# Patient Record
Sex: Male | Born: 1945 | Race: White | Hispanic: No | Marital: Married | Smoking: Former smoker
Health system: Southern US, Community
[De-identification: ages and names within clinical notes are randomized; demographics above are authoritative.]

## PROBLEM LIST (undated history)

## (undated) DIAGNOSIS — E785 Hyperlipidemia, unspecified: Secondary | ICD-10-CM

## (undated) DIAGNOSIS — E079 Disorder of thyroid, unspecified: Secondary | ICD-10-CM

## (undated) DIAGNOSIS — I2510 Atherosclerotic heart disease of native coronary artery without angina pectoris: Secondary | ICD-10-CM

## (undated) DIAGNOSIS — I10 Essential (primary) hypertension: Secondary | ICD-10-CM

## (undated) DIAGNOSIS — I739 Peripheral vascular disease, unspecified: Secondary | ICD-10-CM

## (undated) DIAGNOSIS — E119 Type 2 diabetes mellitus without complications: Secondary | ICD-10-CM

## (undated) DIAGNOSIS — K219 Gastro-esophageal reflux disease without esophagitis: Secondary | ICD-10-CM

## (undated) DIAGNOSIS — I4819 Other persistent atrial fibrillation: Secondary | ICD-10-CM

## (undated) DIAGNOSIS — D649 Anemia, unspecified: Secondary | ICD-10-CM

## (undated) HISTORY — DX: Hyperlipidemia, unspecified: E78.5

## (undated) HISTORY — PX: CHOLECYSTECTOMY: SHX55

## (undated) HISTORY — DX: Type 2 diabetes mellitus without complications: E11.9

## (undated) HISTORY — PX: OTHER SURGICAL HISTORY: SHX169

## (undated) HISTORY — PX: KNEE ARTHROSCOPY W/ PARTIAL MEDIAL MENISCECTOMY: SHX1882

## (undated) HISTORY — PX: ROTATOR CUFF REPAIR: SHX139

## (undated) HISTORY — DX: Essential (primary) hypertension: I10

## (undated) HISTORY — DX: Peripheral vascular disease, unspecified: I73.9

## (undated) HISTORY — DX: Other persistent atrial fibrillation: I48.19

## (undated) HISTORY — DX: Atherosclerotic heart disease of native coronary artery without angina pectoris: I25.10

## (undated) HISTORY — DX: Disorder of thyroid, unspecified: E07.9

---

## 2020-04-16 ENCOUNTER — Encounter: Payer: Self-pay | Admitting: *Deleted

## 2020-04-23 ENCOUNTER — Encounter: Payer: Self-pay | Admitting: Internal Medicine

## 2020-04-23 ENCOUNTER — Encounter: Payer: Self-pay | Admitting: *Deleted

## 2020-04-23 ENCOUNTER — Other Ambulatory Visit: Payer: Self-pay

## 2020-04-23 ENCOUNTER — Ambulatory Visit (INDEPENDENT_AMBULATORY_CARE_PROVIDER_SITE_OTHER): Payer: Medicare Other | Admitting: Internal Medicine

## 2020-04-23 VITALS — BP 114/66 | HR 94 | Ht 68.0 in | Wt 158.6 lb

## 2020-04-23 DIAGNOSIS — R002 Palpitations: Secondary | ICD-10-CM

## 2020-04-23 DIAGNOSIS — I25810 Atherosclerosis of coronary artery bypass graft(s) without angina pectoris: Secondary | ICD-10-CM | POA: Diagnosis not present

## 2020-04-23 DIAGNOSIS — I4819 Other persistent atrial fibrillation: Secondary | ICD-10-CM

## 2020-04-23 NOTE — H&P (View-Only) (Signed)
Electrophysiology Office Note   Date:  04/23/2020   ID:  Thomas Simon, DOB 09/14/1945, MRN [id number]  PCP:  Glori Bickers, MD  Cardiologist:  Clent Ridges) Primary Electrophysiologist: Hillis Range, MD    AF   History of Present Illness: Thomas Simon is a 75 y.o. male who presents today for electrophysiology evaluation.   He has a past medical history significant for CAD s/p CABG, diabetes, HTN, hypothyroidism. He had his COVID booster at the end of November and then early December developed fatigue and shortness of breath with exertion. He was found to have newly identified atrial fibrillation. He was started on Eliquis and reports compliance with no missed doses. Recommendation was for consideration of amiodarone and DCCV but the patient was concerned about amiodarone side effects and asked to be seen today.    He does not smoke, drink, or use recreational drugs. 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 They have 3 sons and 5 grandchildren.    Today, he denies symptoms of palpitations, chest pain, orthopnea, PND, claudication, dizziness, presyncope, syncope, bleeding, or neurologic sequela. The patient is tolerating medications without difficulties and is otherwise without complaint today.    Past Medical History:  Diagnosis Date  . Coronary arteriosclerosis   . Diabetes mellitus without complication (HCC)   . Hyperlipidemia   . Hypertension   . PVD (peripheral vascular disease) (HCC)   . Thyroid disease    Past Surgical History:  Procedure Laterality Date  . CHOLECYSTECTOMY    . KNEE ARTHROSCOPY W/ PARTIAL MEDIAL MENISCECTOMY    . open heart surgery    . ROTATOR CUFF REPAIR       Current Outpatient Medications  Medication Sig Dispense Refill  . apixaban (ELIQUIS) 5 MG TABS tablet Take 5 mg by mouth 2 (two) times daily.    Marland Kitchen aspirin EC 81 MG tablet Take 81 mg by mouth daily. Swallow whole.    Marland Kitchen doxazosin (CARDURA) 4 MG tablet Take 4 mg by mouth daily.     [employer] Lanius Omega-3 500 MG CAPS Take by mouth.    . levothyroxine (SYNTHROID) 25 MCG tablet Take by mouth daily before breakfast.    . metFORMIN (GLUCOPHAGE) 500 MG tablet Take 500 mg by mouth 2 (two) times daily with a meal.    . metoprolol succinate (TOPROL-XL) 50 MG 24 hr tablet Take 50 mg by mouth daily. Take with or immediately following a meal.    . pantoprazole (PROTONIX) 40 MG tablet Take 40 mg by mouth daily.    . simvastatin (ZOCOR) 20 MG tablet Take 20 mg by mouth daily.    . vitamin B-12 (CYANOCOBALAMIN) 1000 MCG tablet Take 1,000 mcg by mouth daily.     No current facility-administered medications for this visit.    Allergies:   Celecoxib, Fentanyl, Hydrocodone, and Tramadol   Social History:  The patient  reports that he quit smoking about 16 years ago. His smokeless tobacco use includes chew. He reports that he does not drink alcohol and does not use drugs.   Family History:  The patient's family history includes CAD and DM in mother and stroke in father.    ROS:  Please see the history of present illness.   All other systems are personally reviewed and negative.    PHYSICAL EXAM: VS:  BP 114/66   Pulse 94   Ht 5\' 8"  (1.727 m)   Wt 158 lb 9.6 oz (71.9 kg)   SpO2 98%  BMI 24.12 kg/m  , BMI Body mass index is 24.12 kg/m. GEN: Well nourished, well developed, in no acute distress HEENT: normal Neck: no JVD, carotid bruits, or masses Cardiac: iRRR; no murmurs, rubs, or gallops,no edema  Respiratory:  clear to auscultation bilaterally, normal work of breathing GI: soft, nontender, nondistended, + BS MS: no deformity or atrophy Skin: warm and dry  Neuro:  Strength and sensation are intact Psych: euthymic mood, full affect   Recent Labs: No results found for requested labs within last 8760 hours.  personally reviewed   Lipid Panel  No results found for: CHOL, TRIG, HDL, CHOLHDL, VLDL, LDLCALC, LDLDIRECT personally reviewed   Wt Readings from Last 3  Encounters:  04/23/20 158 lb 9.6 oz (71.9 kg)      Other studies personally reviewed: Additional studies/ records that were reviewed today include: outside records   ASSESSMENT AND PLAN:  1.  Persistent atrial fibrillation The patient has persistent symptomatic atrial fibrillation On appropriate dose of Eliquis for CHADS2VASC of 5 Treatment options reviewed today. At this time, he would like to proceed with cardioversion in the absence of AAD therapy. Risks, benefits reviewed with patient who wishes to proceed. Will schedule at next available time. If he has further afib, we could consider multaq, tikosyn, amiodarone or ablation.  2.  HTN Stable No change required today  3.  CAD s/p CABG No recent ischemic symptoms Stop ASA   Risks, benefits and potential toxicities for medications prescribed and/or refilled reviewed with patient today.    Follow-up:  AF clinic 1 week after cardioversion I will see again in 6-8 weeks  Current medicines are reviewed at length with the patient today.   The patient does not have concerns regarding his medicines.  The following changes were made today:  none  Labs/ tests ordered today include:  Orders Placed This Encounter  Procedures  . EKG 12-Lead     Signed, Hillis Range, MD  04/23/2020 10:44 AM     [facility] [address] [place] [address] [phone] (office) [phone] (fax)

## 2020-04-23 NOTE — Patient Instructions (Addendum)
Medication Instructions:  Stop aspirin *If you need a refill on your cardiac medications before your next appointment, please call your pharmacy*   Lab Work: none If you have labs (blood work) drawn today and your tests are completely normal, you will receive your results only by: Marland Kitchen MyChart Message (if you have MyChart) OR . A paper copy in the mail If you have any lab test that is abnormal or we need to change your treatment, we will call you to review the results.   Testing/Procedures: Your physician has recommended that you have a Cardioversion (DCCV). Electrical Cardioversion uses a jolt of electricity to your heart either through paddles or wired patches attached to your chest. This is a controlled, usually prescheduled, procedure. Defibrillation is done under light anesthesia in the hospital, and you usually go home the day of the procedure. This is done to get your heart back into a normal rhythm. You are not awake for the procedure. Please see the instruction sheet given to you today.   Follow-Up: At [facility], you and your health needs are our priority.  As part of our continuing mission to provide you with exceptional heart care, we have created designated Provider Care Teams.  These Care Teams include your primary Cardiologist (physician) and Advanced Practice Providers (APPs -  Physician Assistants and Nurse Practitioners) who all work together to provide you with the care you need, when you need it.  We recommend signing up for the patient portal called "MyChart".  Sign up information is provided on this After Visit Summary.  MyChart is used to connect with patients for Virtual Visits (Telemedicine).  Patients are able to view lab/test results, encounter notes, upcoming appointments, etc.  Non-urgent messages can be sent to your provider as well.   To learn more about what you can do with MyChart, go to [email or web address].    06/07/2020 at 11:30 with Dr. Johney Frame  Other  Instructions        COVID TEST--Jan 26 at 1 pm-- You will go to [address] [place], [place] [address]  for your Covid testing.   This is a drive thru test site.   Be sure to share with the first checkpoint that you are there for pre-procedure/surgery testing. Stay in your car and the nurse team will come to your car to test you.  After you are tested please go home and self-quarantine until the day of your procedure.     You are scheduled for a Cardioversion on Apr 30, 2020 with Dr. Duke Salvia.  Please arrive at the [facility] (Main Entrance A) at [facility]: [address] [place], [place] [address] at 8 am. DIET: Nothing to eat or drink after midnight except a sip of water with medications (see medication instructions below)  Medication Instructions: Hold metformin  Continue your anticoagulant: Eliquis You will need to continue your anticoagulant after your procedure until you  are told by your  Provider that it is safe to stop   You must have a responsible person to drive you home and stay in the waiting area during your procedure. Failure to do so could result in cancellation.  Bring your insurance cards.  *Special Note: Every effort is made to have your procedure done on time. Occasionally there are emergencies that occur at the hospital that may cause delays. Please be patient if a delay does occur.

## 2020-04-23 NOTE — Progress Notes (Signed)
 Electrophysiology Office Note   Date:  04/23/2020   ID:  Thomas Simon, DOB 06/04/1945, MRN [id number]  PCP:  Trivedi, Rajendra, MD  Cardiologist:  Zachary ([place]) Primary Electrophysiologist: Oslo Huntsman, MD    AF   History of Present Illness: Thomas Simon is a 75 y.o. male who presents today for electrophysiology evaluation.   He has a past medical history significant for CAD s/p CABG, diabetes, HTN, hypothyroidism. He had his COVID booster at the end of November and then early December developed fatigue and shortness of breath with exertion. He was found to have newly identified atrial fibrillation. He was started on Eliquis and reports compliance with no missed doses. Recommendation was for consideration of amiodarone and DCCV but the patient was concerned about amiodarone side effects and asked to be seen today.    He does not smoke, drink, or use recreational drugs. 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 They have 3 sons and 5 grandchildren.    Today, he denies symptoms of palpitations, chest pain, orthopnea, PND, claudication, dizziness, presyncope, syncope, bleeding, or neurologic sequela. The patient is tolerating medications without difficulties and is otherwise without complaint today.    Past Medical History:  Diagnosis Date  . Coronary arteriosclerosis   . Diabetes mellitus without complication (HCC)   . Hyperlipidemia   . Hypertension   . PVD (peripheral vascular disease) (HCC)   . Thyroid disease    Past Surgical History:  Procedure Laterality Date  . CHOLECYSTECTOMY    . KNEE ARTHROSCOPY W/ PARTIAL MEDIAL MENISCECTOMY    . open heart surgery    . ROTATOR CUFF REPAIR       Current Outpatient Medications  Medication Sig Dispense Refill  . apixaban (ELIQUIS) 5 MG TABS tablet Take 5 mg by mouth 2 (two) times daily.    . aspirin EC 81 MG tablet Take 81 mg by mouth daily. Swallow whole.    . doxazosin (CARDURA) 4 MG tablet Take 4 mg by mouth daily.     . Krill Oil Omega-3 500 MG CAPS Take by mouth.    . levothyroxine (SYNTHROID) 25 MCG tablet Take by mouth daily before breakfast.    . metFORMIN (GLUCOPHAGE) 500 MG tablet Take 500 mg by mouth 2 (two) times daily with a meal.    . metoprolol succinate (TOPROL-XL) 50 MG 24 hr tablet Take 50 mg by mouth daily. Take with or immediately following a meal.    . pantoprazole (PROTONIX) 40 MG tablet Take 40 mg by mouth daily.    . simvastatin (ZOCOR) 20 MG tablet Take 20 mg by mouth daily.    . vitamin B-12 (CYANOCOBALAMIN) 1000 MCG tablet Take 1,000 mcg by mouth daily.     No current facility-administered medications for this visit.    Allergies:   Celecoxib, Fentanyl, Hydrocodone, and Tramadol   Social History:  The patient  reports that he quit smoking about 16 years ago. His smokeless tobacco use includes chew. He reports that he does not drink alcohol and does not use drugs.   Family History:  The patient's family history includes CAD and DM in mother and stroke in father.    ROS:  Please see the history of present illness.   All other systems are personally reviewed and negative.    PHYSICAL EXAM: VS:  BP 114/66   Pulse 94   Ht 5' 8" (1.727 m)   Wt 158 lb 9.6 oz (71.9 kg)   SpO2 98%     BMI 24.12 kg/m  , BMI Body mass index is 24.12 kg/m. GEN: Well nourished, well developed, in no acute distress HEENT: normal Neck: no JVD, carotid bruits, or masses Cardiac: iRRR; no murmurs, rubs, or gallops,no edema  Respiratory:  clear to auscultation bilaterally, normal work of breathing GI: soft, nontender, nondistended, + BS MS: no deformity or atrophy Skin: warm and dry  Neuro:  Strength and sensation are intact Psych: euthymic mood, full affect   Recent Labs: No results found for requested labs within last 8760 hours.  personally reviewed   Lipid Panel  No results found for: CHOL, TRIG, HDL, CHOLHDL, VLDL, LDLCALC, LDLDIRECT personally reviewed   Wt Readings from Last 3  Encounters:  04/23/20 158 lb 9.6 oz (71.9 kg)      Other studies personally reviewed: Additional studies/ records that were reviewed today include: outside records   ASSESSMENT AND PLAN:  1.  Persistent atrial fibrillation The patient has persistent symptomatic atrial fibrillation On appropriate dose of Eliquis for CHADS2VASC of 5 Treatment options reviewed today. At this time, he would like to proceed with cardioversion in the absence of AAD therapy. Risks, benefits reviewed with patient who wishes to proceed. Will schedule at next available time. If he has further afib, we could consider multaq, tikosyn, amiodarone or ablation.  2.  HTN Stable No change required today  3.  CAD s/p CABG No recent ischemic symptoms Stop ASA   Risks, benefits and potential toxicities for medications prescribed and/or refilled reviewed with patient today.    Follow-up:  AF clinic 1 week after cardioversion I will see again in 6-8 weeks  Current medicines are reviewed at length with the patient today.   The patient does not have concerns regarding his medicines.  The following changes were made today:  none  Labs/ tests ordered today include:  Orders Placed This Encounter  Procedures  . EKG 12-Lead     Signed, Hillis Range, MD  04/23/2020 10:44 AM     [facility] [address] [place] [address] [phone] (office) [phone] (fax)

## 2020-04-28 ENCOUNTER — Other Ambulatory Visit (HOSPITAL_COMMUNITY)
Admission: RE | Admit: 2020-04-28 | Discharge: 2020-04-28 | Disposition: A | Discharge status: Home / Self Care | Payer: Medicare Other | Source: Ambulatory Visit | Attending: Cardiovascular Disease | Admitting: Cardiovascular Disease

## 2020-04-28 DIAGNOSIS — Z01812 Encounter for preprocedural laboratory examination: Secondary | ICD-10-CM | POA: Insufficient documentation

## 2020-04-28 DIAGNOSIS — Z20822 Contact with and (suspected) exposure to covid-19: Secondary | ICD-10-CM | POA: Insufficient documentation

## 2020-04-28 LAB — SARS CORONAVIRUS 2 (TAT 6-24 HRS): SARS Coronavirus 2: NEGATIVE

## 2020-04-30 ENCOUNTER — Other Ambulatory Visit: Payer: Self-pay

## 2020-04-30 ENCOUNTER — Ambulatory Visit (HOSPITAL_COMMUNITY): Payer: Medicare Other | Admitting: Anesthesiology

## 2020-04-30 ENCOUNTER — Encounter (HOSPITAL_COMMUNITY): Payer: Self-pay | Admitting: Cardiovascular Disease

## 2020-04-30 ENCOUNTER — Ambulatory Visit (HOSPITAL_COMMUNITY)
Admission: RE | Admit: 2020-04-30 | Discharge: 2020-04-30 | Disposition: A | Discharge status: Home / Self Care | Payer: Medicare Other | Attending: Cardiovascular Disease | Admitting: Cardiovascular Disease

## 2020-04-30 ENCOUNTER — Encounter (HOSPITAL_COMMUNITY)
Admission: RE | Disposition: A | Discharge status: Home / Self Care | Payer: Self-pay | Source: Home / Self Care | Attending: Cardiovascular Disease

## 2020-04-30 DIAGNOSIS — I2510 Atherosclerotic heart disease of native coronary artery without angina pectoris: Secondary | ICD-10-CM | POA: Diagnosis not present

## 2020-04-30 DIAGNOSIS — Z79899 Other long term (current) drug therapy: Secondary | ICD-10-CM | POA: Insufficient documentation

## 2020-04-30 DIAGNOSIS — I4819 Other persistent atrial fibrillation: Secondary | ICD-10-CM | POA: Insufficient documentation

## 2020-04-30 DIAGNOSIS — Z951 Presence of aortocoronary bypass graft: Secondary | ICD-10-CM | POA: Diagnosis not present

## 2020-04-30 DIAGNOSIS — Z885 Allergy status to narcotic agent status: Secondary | ICD-10-CM | POA: Insufficient documentation

## 2020-04-30 DIAGNOSIS — Z79890 Hormone replacement therapy (postmenopausal): Secondary | ICD-10-CM | POA: Insufficient documentation

## 2020-04-30 DIAGNOSIS — Z888 Allergy status to other drugs, medicaments and biological substances status: Secondary | ICD-10-CM | POA: Insufficient documentation

## 2020-04-30 DIAGNOSIS — Z7982 Long term (current) use of aspirin: Secondary | ICD-10-CM | POA: Insufficient documentation

## 2020-04-30 DIAGNOSIS — Z87891 Personal history of nicotine dependence: Secondary | ICD-10-CM | POA: Diagnosis not present

## 2020-04-30 DIAGNOSIS — Z7901 Long term (current) use of anticoagulants: Secondary | ICD-10-CM | POA: Diagnosis not present

## 2020-04-30 DIAGNOSIS — I10 Essential (primary) hypertension: Secondary | ICD-10-CM | POA: Insufficient documentation

## 2020-04-30 DIAGNOSIS — Z7984 Long term (current) use of oral hypoglycemic drugs: Secondary | ICD-10-CM | POA: Insufficient documentation

## 2020-04-30 HISTORY — PX: CARDIOVERSION: SHX1299

## 2020-04-30 LAB — POCT I-STAT, CHEM 8
BUN: 19 mg/dL (ref 8–23)
Calcium, Ion: 1.19 mmol/L (ref 1.15–1.40)
Chloride: 98 mmol/L (ref 98–111)
Creatinine, Ser: 0.9 mg/dL (ref 0.61–1.24)
Glucose, Bld: 155 mg/dL — ABNORMAL HIGH (ref 70–99)
HCT: 35 % — ABNORMAL LOW (ref 39.0–52.0)
Hemoglobin: 11.9 g/dL — ABNORMAL LOW (ref 13.0–17.0)
Potassium: 4.5 mmol/L (ref 3.5–5.1)
Sodium: 138 mmol/L (ref 135–145)
TCO2: 25 mmol/L (ref 22–32)

## 2020-04-30 SURGERY — CARDIOVERSION
Anesthesia: General

## 2020-04-30 MED ORDER — PROPOFOL 10 MG/ML IV BOLUS
INTRAVENOUS | Status: DC | PRN
  Administered 2020-04-30: 50 mg via INTRAVENOUS

## 2020-04-30 MED ORDER — LIDOCAINE 2% (20 MG/ML) 5 ML SYRINGE
INTRAMUSCULAR | Status: DC | PRN
  Administered 2020-04-30: 60 mg via INTRAVENOUS

## 2020-04-30 MED ORDER — SODIUM CHLORIDE 0.9 % IV SOLN
INTRAVENOUS | Status: AC | PRN
  Administered 2020-04-30: 500 mL via INTRAVENOUS

## 2020-04-30 NOTE — Transfer of Care (Signed)
Immediate Anesthesia Transfer of Care Note  Patient: Thomas Simon  Procedure(s) Performed: CARDIOVERSION (N/A )  Patient Location: PACU and Endoscopy Unit  Anesthesia Type:General  Level of Consciousness: drowsy and patient cooperative  Airway & Oxygen Therapy: Patient Spontanous Breathing and Patient connected to nasal cannula oxygen  Post-op Assessment: Report given to RN and Post -op Vital signs reviewed and stable  Post vital signs: Reviewed and stable  Last Vitals:  Vitals Value Taken Time  BP    Temp    Pulse    Resp    SpO2      Last Pain:  Vitals:   04/30/20 0846  TempSrc: Oral  PainSc: 0-No pain         Complications: No complications documented.

## 2020-04-30 NOTE — CV Procedure (Signed)
Electrical Cardioversion Procedure Note Thomas Simon [id number] September 22, 1945  Procedure: Electrical Cardioversion Indications:  Atrial Fibrillation  Procedure Details Consent: Risks of procedure as well as the alternatives and risks of each were explained to the (patient/caregiver).  Consent for procedure obtained. Time Out: Verified patient identification, verified procedure, site/side was marked, verified correct patient position, special equipment/implants available, medications/allergies/relevent history reviewed, required imaging and test results available.  Performed  Patient placed on cardiac monitor, pulse oximetry, supplemental oxygen as necessary.  Sedation given: propofol Pacer pads placed anterior and posterior chest.  Cardioverted 1 time(s).  Cardioverted at 150J.  Evaluation Findings: Post procedure EKG shows: sinus rhythm with PACs Complications: None Patient did tolerate procedure well.   Chilton Si, MD 04/30/2020, 9:27 AM

## 2020-04-30 NOTE — Interval H&P Note (Signed)
History and Physical Interval Note:  04/30/2020 9:16 AM  Thomas Simon  has presented today for surgery, with the diagnosis of A-FIB.  The various methods of treatment have been discussed with the patient and family. After consideration of risks, benefits and other options for treatment, the patient has consented to  Procedure(s): CARDIOVERSION (N/A) as a surgical intervention.  The patient's history has been reviewed, patient examined, no change in status, stable for surgery.  I have reviewed the patient's chart and labs.  Questions were answered to the patient's satisfaction.     Chilton Si, MD

## 2020-04-30 NOTE — Discharge Instructions (Signed)

## 2020-04-30 NOTE — Anesthesia Preprocedure Evaluation (Addendum)
Anesthesia Evaluation  Patient identified by MRN, date of birth, ID band Patient awake    Reviewed: Allergy & Precautions, H&P , NPO status , Patient's Chart, lab work & pertinent test results, reviewed documented beta blocker date and time   Airway Mallampati: II  TM Distance: >3 FB Neck ROM: Full    Dental no notable dental hx. (+) Upper Dentures, Dental Advisory Given   Pulmonary neg pulmonary ROS, former smoker,    Pulmonary exam normal breath sounds clear to auscultation       Cardiovascular hypertension, Pt. on medications and Pt. on home beta blockers + Peripheral Vascular Disease  + dysrhythmias Atrial Fibrillation  Rhythm:Irregular Rate:Normal     Neuro/Psych negative neurological ROS  negative psych ROS   GI/Hepatic negative GI ROS, Neg liver ROS,   Endo/Other  diabetes, Type 2, Oral Hypoglycemic Agents  Renal/GU negative Renal ROS  negative genitourinary   Musculoskeletal   Abdominal   Peds  Hematology negative hematology ROS (+)   Anesthesia Other Findings   Reproductive/Obstetrics negative OB ROS                            Anesthesia Physical Anesthesia Plan  ASA: III  Anesthesia Plan: General   Post-op Pain Management:    Induction: Intravenous  PONV Risk Score and Plan: 2 and Propofol infusion and Treatment may vary due to age or medical condition  Airway Management Planned: Mask  Additional Equipment:   Intra-op Plan:   Post-operative Plan:   Informed Consent: I have reviewed the patients History and Physical, chart, labs and discussed the procedure including the risks, benefits and alternatives for the proposed anesthesia with the patient or authorized representative who has indicated his/her understanding and acceptance.     Dental advisory given  Plan Discussed with: CRNA  Anesthesia Plan Comments:         Anesthesia Quick Evaluation

## 2020-04-30 NOTE — Anesthesia Postprocedure Evaluation (Signed)
Anesthesia Post Note  Patient: Thomas Simon  Procedure(s) Performed: CARDIOVERSION (N/A )     Patient location during evaluation: Endoscopy Anesthesia Type: General Level of consciousness: awake and alert Pain management: pain level controlled Vital Signs Assessment: post-procedure vital signs reviewed and stable Respiratory status: spontaneous breathing, nonlabored ventilation and respiratory function stable Cardiovascular status: blood pressure returned to baseline and stable Postop Assessment: no apparent nausea or vomiting Anesthetic complications: no   No complications documented.  Last Vitals:  Vitals:   04/30/20 0950 04/30/20 1000  BP: 113/76 130/67  Pulse: 72 65  Resp: 15 16  Temp:    SpO2: 98% 97%    Last Pain:  Vitals:   04/30/20 1010  TempSrc:   PainSc: 0-No pain                 Bethan Adamek,W. EDMOND

## 2020-05-02 ENCOUNTER — Encounter (HOSPITAL_COMMUNITY): Payer: Self-pay | Admitting: Cardiovascular Disease

## 2020-05-06 ENCOUNTER — Ambulatory Visit (HOSPITAL_COMMUNITY): Payer: No Typology Code available for payment source | Admitting: Physician Assistant

## 2020-05-06 ENCOUNTER — Ambulatory Visit (HOSPITAL_COMMUNITY)
Admission: RE | Admit: 2020-05-06 | Discharge: 2020-05-06 | Disposition: A | Discharge status: Home / Self Care | Payer: Medicare Other | Source: Ambulatory Visit | Attending: Physician Assistant | Admitting: Physician Assistant

## 2020-05-06 ENCOUNTER — Encounter (HOSPITAL_COMMUNITY): Payer: Self-pay | Admitting: Physician Assistant

## 2020-05-06 ENCOUNTER — Other Ambulatory Visit: Payer: Self-pay

## 2020-05-06 VITALS — BP 102/68 | HR 75 | Ht 68.0 in | Wt 156.4 lb

## 2020-05-06 DIAGNOSIS — I2510 Atherosclerotic heart disease of native coronary artery without angina pectoris: Secondary | ICD-10-CM | POA: Diagnosis not present

## 2020-05-06 DIAGNOSIS — Z888 Allergy status to other drugs, medicaments and biological substances status: Secondary | ICD-10-CM | POA: Insufficient documentation

## 2020-05-06 DIAGNOSIS — I10 Essential (primary) hypertension: Secondary | ICD-10-CM | POA: Diagnosis not present

## 2020-05-06 DIAGNOSIS — Z951 Presence of aortocoronary bypass graft: Secondary | ICD-10-CM | POA: Insufficient documentation

## 2020-05-06 DIAGNOSIS — Z7901 Long term (current) use of anticoagulants: Secondary | ICD-10-CM | POA: Diagnosis not present

## 2020-05-06 DIAGNOSIS — E039 Hypothyroidism, unspecified: Secondary | ICD-10-CM | POA: Insufficient documentation

## 2020-05-06 DIAGNOSIS — Z885 Allergy status to narcotic agent status: Secondary | ICD-10-CM | POA: Insufficient documentation

## 2020-05-06 DIAGNOSIS — I4819 Other persistent atrial fibrillation: Secondary | ICD-10-CM | POA: Diagnosis present

## 2020-05-06 DIAGNOSIS — E119 Type 2 diabetes mellitus without complications: Secondary | ICD-10-CM | POA: Diagnosis not present

## 2020-05-06 DIAGNOSIS — D6869 Other thrombophilia: Secondary | ICD-10-CM | POA: Diagnosis not present

## 2020-05-06 DIAGNOSIS — Z7984 Long term (current) use of oral hypoglycemic drugs: Secondary | ICD-10-CM | POA: Diagnosis not present

## 2020-05-06 DIAGNOSIS — Z79890 Hormone replacement therapy (postmenopausal): Secondary | ICD-10-CM | POA: Insufficient documentation

## 2020-05-06 DIAGNOSIS — Z87891 Personal history of nicotine dependence: Secondary | ICD-10-CM | POA: Insufficient documentation

## 2020-05-06 DIAGNOSIS — Z886 Allergy status to analgesic agent status: Secondary | ICD-10-CM | POA: Diagnosis not present

## 2020-05-06 DIAGNOSIS — Z79899 Other long term (current) drug therapy: Secondary | ICD-10-CM | POA: Insufficient documentation

## 2020-05-06 NOTE — Progress Notes (Signed)
Primary Care Physician: Glori Bickers, MD Primary Cardiologist: Dr Earna Coder Primary Electrophysiologist: Dr Johney Frame Referring Physician: Dr Johney Frame   Thomas Simon is a 75 y.o. male with a history of CAD s/p CABG, diabetes, HTN, hypothyroidism, and atrial fibrillation who presents for follow up in the [facility]. The patient was initially diagnosed with atrial fibrillation 02/2020 after presenting with symptoms of fatigue and SOB. Patient is on Eliquis for a CHADS2VASC score of 5. He underwent DCCV on 04/30/20. Unfortunately, he was back in afib before even leaving the hospital. He does note that he has more fatigue and SOB while in afib. He denies any bleeding issues on anticoagulation.   Today, he denies symptoms of palpitations, chest pain, orthopnea, PND, lower extremity edema, dizziness, presyncope, syncope, snoring, daytime somnolence, bleeding, or neurologic sequela. The patient is tolerating medications without difficulties and is otherwise without complaint today.    Atrial Fibrillation Risk Factors:  he does not have symptoms or diagnosis of sleep apnea. he does not have a history of rheumatic fever. he does not have a history of alcohol use.   he has a BMI of Body mass index is 23.78 kg/m.[place] Kitchen Filed Weights   05/06/20 1402  Weight: 70.9 kg    Family History  Problem Relation Age of Onset  . Heart failure Mother   . Lung disease Father      Atrial Fibrillation Management history:  Previous antiarrhythmic drugs: none Previous cardioversions: 04/30/20 Previous ablations: none CHADS2VASC score: 5 Anticoagulation history: Eliquis   Past Medical History:  Diagnosis Date  . Coronary arteriosclerosis   . Diabetes mellitus without complication (HCC)   . Hyperlipidemia   . Hypertension   . PVD (peripheral vascular disease) (HCC)   . Thyroid disease    Past Surgical History:  Procedure Laterality Date  . CARDIOVERSION N/A 04/30/2020    Procedure: CARDIOVERSION;  Surgeon: Chilton Si, MD;  Location: [facility] ENDOSCOPY;  Service: Cardiovascular;  Laterality: N/A;  . CHOLECYSTECTOMY    . KNEE ARTHROSCOPY W/ PARTIAL MEDIAL MENISCECTOMY    . open heart surgery    . ROTATOR CUFF REPAIR      Current Outpatient Medications  Medication Sig Dispense Refill  . apixaban (ELIQUIS) 5 MG TABS tablet Take 5 mg by mouth 2 (two) times daily.    . Calcium-Magnesium-Zinc (CAL-MAG-ZINC PO) Take 1 tablet by mouth 3 (three) times daily.    . Cholecalciferol (VITAMIN D3) 50 MCG (2000 UT) TABS Take 2,000 Units by mouth daily.    [place] Kitchen doxazosin (CARDURA) 4 MG tablet Take 4 mg by mouth at bedtime.    [place] Kitchen guaiFENesin (MUCINEX) 600 MG 12 hr tablet Take 1,200 mg by mouth 2 (two) times daily as needed for to loosen phlegm.    Boris Lown Oil Omega-3 500 MG CAPS Take 500 mg by mouth daily.    [place] Kitchen levothyroxine (SYNTHROID) 25 MCG tablet Take 25 mcg by mouth daily before breakfast.    . metFORMIN (GLUCOPHAGE) 500 MG tablet Take 500 mg by mouth 2 (two) times daily with a meal.    . metoprolol succinate (TOPROL-XL) 50 MG 24 hr tablet Take 50 mg by mouth daily. Take with or immediately following a meal.    . Multiple Vitamins-Minerals (MULTIVITAMIN WITH MINERALS) tablet Take 1 tablet by mouth daily.    . pantoprazole (PROTONIX) 40 MG tablet Take 40 mg by mouth daily.    . simvastatin (ZOCOR) 20 MG tablet Take 20 mg by mouth at bedtime.    [place] Kitchen  vitamin B-12 (CYANOCOBALAMIN) 1000 MCG tablet Take 1,000 mcg by mouth daily.     No current facility-administered medications for this encounter.    Allergies  Allergen Reactions  . Celecoxib Shortness Of Breath    unknown  . Fentanyl     rash  . Hydrocodone Other (See Comments)    urinary retention  . Tramadol Other (See Comments)    urinary retention    Social History   Socioeconomic History  . Marital status: Married    Spouse name: Not on file  . Number of children: Not on file  . Years of education: Not on  file  . Highest education level: Not on file  Occupational History  . Not on file  Tobacco Use  . Smoking status: Former Smoker    Quit date: 07/16/2003    Years since quitting: 16.8  . Smokeless tobacco: Current User    Types: Chew  Vaping Use  . Vaping Use: Unknown  Substance and Sexual Activity  . Alcohol use: Never  . Drug use: Never  . Sexual activity: Not on file  Other Topics Concern  . Not on file  Social History Narrative  . Not on file   Social Determinants of Health   Financial Resource Strain: Not on file  Food Insecurity: Not on file  Transportation Needs: Not on file  Physical Activity: Not on file  Stress: Not on file  Social Connections: Not on file  Intimate Partner Violence: Not on file     ROS- All systems are reviewed and negative except as per the HPI above.  Physical Exam: Vitals:   05/06/20 1402  BP: 102/68  Pulse: 75  Weight: 70.9 kg  Height: 5\' 8"  (1.727 m)    GEN- The patient is well appearing elderly male, alert and oriented x 3 today.   Head- normocephalic, atraumatic Eyes-  Sclera clear, conjunctiva pink Ears- hearing intact Oropharynx- clear Neck- supple  Lungs- Clear to ausculation bilaterally, normal work of breathing Heart- irregular rate and rhythm, no murmurs, rubs or gallops  GI- soft, NT, ND, + BS Extremities- no clubbing, cyanosis, or edema MS- no significant deformity or atrophy Skin- no rash or lesion Psych- euthymic mood, full affect Neuro- strength and sensation are intact  Wt Readings from Last 3 Encounters:  05/06/20 70.9 kg  04/30/20 72.6 kg  04/23/20 71.9 kg    EKG today demonstrates  Afib  Vent. rate 75 BPM QRS duration 90 ms QT/QTc 366/408 ms  Epic records are reviewed at length today  CHA2DS2-VASc Score = 5  The patient's score is based upon: CHF History: No HTN History: Yes Diabetes History: Yes Stroke History: No Vascular Disease History: Yes Age Score: 2 Gender Score: 0       ASSESSMENT AND PLAN: 1. Persistent Atrial Fibrillation (ICD10:  I48.19) The patient's CHA2DS2-VASc score is 5, indicating a 7.2% annual risk of stroke.   S/p DCCV on 04/30/20 with early return of afib. We discussed therapeutic options today including AAD vs ablation. He would like to avoid medication if possible. Risk, benefits, and alternatives to EP study and radiofrequency ablation for afib were also discussed in detail today. These risks include but are not limited to stroke, bleeding, vascular damage, tamponade, perforation, damage to the esophagus, lungs, and other structures, pulmonary vein stenosis, worsening renal function, and death. The patient understands these risk and wishes to proceed. Will have him follow up with Dr 05/02/20 for consideration.  Continue Eliquis 5 mg BID Continue Toprol  50 mg daily  2. Secondary Hypercoagulable State (ICD10:  D68.69) The patient is at significant risk for stroke/thromboembolism based upon his CHA2DS2-VASc Score of 5.  Continue Apixaban (Eliquis).   3. CAD S/p CABG No anginal symptoms.  4. HTN Stable, no changes today.   Follow up with Dr Johney Frame.   [name] PA-C [facility] [address] [place], [place] [address] [phone] 05/06/2020 2:44 PM

## 2020-05-28 ENCOUNTER — Telehealth: Payer: Self-pay | Admitting: *Deleted

## 2020-05-28 ENCOUNTER — Encounter: Payer: Self-pay | Admitting: *Deleted

## 2020-05-28 DIAGNOSIS — I4891 Unspecified atrial fibrillation: Secondary | ICD-10-CM

## 2020-05-28 NOTE — Telephone Encounter (Signed)
Patient returned call to set up afib ablation. Scheduled ablation. Ordered Cardiac CT and labs. Scheduled labs and covid testing.

## 2020-05-28 NOTE — Telephone Encounter (Signed)
Left message to call back  

## 2020-05-28 NOTE — Telephone Encounter (Signed)
-----   Message from Hillis Range, MD sent at 05/12/2020  2:40 PM EST ----- Regarding: RE: ablation You can schedule but I would like to see him to discuss in the office before the actual procedure day.  ----- Message ----- From: Sampson Goon, RN Sent: 05/06/2020   3:29 PM EST To: Hillis Range, MD Subject: FW: ablation                                   Okay to schedule for ablation? Inetta Fermo  ----- Message ----- From: Shona Simpson, RN Sent: 05/06/2020   2:35 PM EST To: Sampson Goon, RN Subject: ablation                                       Pt failed cardioversion - would like to schedule ablation. Not sure if allred will need to see him again or if you can just schedule. Thanks [employer]

## 2020-05-31 ENCOUNTER — Other Ambulatory Visit: Payer: Medicare Other | Admitting: *Deleted

## 2020-05-31 ENCOUNTER — Other Ambulatory Visit: Payer: Self-pay

## 2020-05-31 DIAGNOSIS — I4891 Unspecified atrial fibrillation: Secondary | ICD-10-CM

## 2020-05-31 LAB — CBC WITH DIFFERENTIAL/PLATELET
Basophils Absolute: 0 10*3/uL (ref 0.0–0.2)
Basos: 0 %
EOS (ABSOLUTE): 0.1 10*3/uL (ref 0.0–0.4)
Eos: 1 %
Hematocrit: 35 % — ABNORMAL LOW (ref 37.5–51.0)
Hemoglobin: 10.9 g/dL — ABNORMAL LOW (ref 13.0–17.7)
Lymphocytes Absolute: 1.2 10*3/uL (ref 0.7–3.1)
Lymphs: 24 %
MCH: 25.5 pg — ABNORMAL LOW (ref 26.6–33.0)
MCHC: 31.1 g/dL — ABNORMAL LOW (ref 31.5–35.7)
MCV: 82 fL (ref 79–97)
Monocytes Absolute: 0.5 10*3/uL (ref 0.1–0.9)
Monocytes: 9 %
Neutrophils Absolute: 3.4 10*3/uL (ref 1.4–7.0)
Neutrophils: 66 %
Platelets: 152 10*3/uL (ref 150–450)
RBC: 4.28 x10E6/uL (ref 4.14–5.80)
RDW: 16.3 % — ABNORMAL HIGH (ref 11.6–15.4)
WBC: 5.2 10*3/uL (ref 3.4–10.8)

## 2020-05-31 LAB — BASIC METABOLIC PANEL
BUN/Creatinine Ratio: 27 — ABNORMAL HIGH (ref 10–24)
BUN: 20 mg/dL (ref 8–27)
CO2: 27 mmol/L (ref 20–29)
Calcium: 9.2 mg/dL (ref 8.6–10.2)
Chloride: 102 mmol/L (ref 96–106)
Creatinine, Ser: 0.75 mg/dL — ABNORMAL LOW (ref 0.76–1.27)
Glucose: 198 mg/dL — ABNORMAL HIGH (ref 65–99)
Potassium: 4.6 mmol/L (ref 3.5–5.2)
Sodium: 137 mmol/L (ref 134–144)
eGFR: 94 mL/min/{1.73_m2} (ref >59)

## 2020-06-07 ENCOUNTER — Other Ambulatory Visit: Payer: Self-pay

## 2020-06-07 ENCOUNTER — Telehealth (HOSPITAL_COMMUNITY): Payer: Self-pay | Admitting: *Deleted

## 2020-06-07 ENCOUNTER — Ambulatory Visit (INDEPENDENT_AMBULATORY_CARE_PROVIDER_SITE_OTHER): Payer: Medicare Other | Admitting: Internal Medicine

## 2020-06-07 ENCOUNTER — Encounter: Payer: Self-pay | Admitting: Internal Medicine

## 2020-06-07 VITALS — BP 118/64 | HR 87 | Ht 68.0 in | Wt 160.6 lb

## 2020-06-07 DIAGNOSIS — I4819 Other persistent atrial fibrillation: Secondary | ICD-10-CM

## 2020-06-07 DIAGNOSIS — I25810 Atherosclerosis of coronary artery bypass graft(s) without angina pectoris: Secondary | ICD-10-CM

## 2020-06-07 DIAGNOSIS — I10 Essential (primary) hypertension: Secondary | ICD-10-CM | POA: Diagnosis not present

## 2020-06-07 NOTE — Telephone Encounter (Signed)
Attempted to call patient regarding upcoming cardiac CT appointment. °Left message on voicemail with name and callback number ° °Nikolaos Maddocks RN Navigator Cardiac Imaging °[facility] °[phone] Office °[phone] Cell ° °

## 2020-06-07 NOTE — Progress Notes (Signed)
PCP: Glori Bickers, MD   Primary EP: Dr Johney Frame  Thomas Simon is a 75 y.o. male who presents today for routine electrophysiology followup.  Since last being seen in our clinic, the patient reports doing reasonably well.  He continues to struggle with afib.  Recent cardioversion was not effective as he quickly returned to afib.  + fatigue and decreased exercise tolerance. Today, he denies symptoms of palpitations, chest pain, shortness of breath,  lower extremity edema, dizziness, presyncope, or syncope.  The patient is otherwise without complaint today.   Past Medical History:  Diagnosis Date  . Coronary arteriosclerosis   . Diabetes mellitus without complication (HCC)   . Hyperlipidemia   . Hypertension   . Persistent atrial fibrillation (HCC)   . PVD (peripheral vascular disease) (HCC)   . Thyroid disease    Past Surgical History:  Procedure Laterality Date  . CARDIOVERSION N/A 04/30/2020   Procedure: CARDIOVERSION;  Surgeon: Chilton Si, MD;  Location: [facility] ENDOSCOPY;  Service: Cardiovascular;  Laterality: N/A;  . CHOLECYSTECTOMY    . KNEE ARTHROSCOPY W/ PARTIAL MEDIAL MENISCECTOMY    . open heart surgery    . ROTATOR CUFF REPAIR      ROS- all systems are reviewed and negatives except as per HPI above  Current Outpatient Medications  Medication Sig Dispense Refill  . apixaban (ELIQUIS) 5 MG TABS tablet Take 5 mg by mouth 2 (two) times daily.    . Calcium-Magnesium-Zinc (CAL-MAG-ZINC PO) Take 1 tablet by mouth 3 (three) times daily.    . Cholecalciferol (VITAMIN D3) 50 MCG (2000 UT) TABS Take 2,000 Units by mouth daily.    [place] Kitchen doxazosin (CARDURA) 4 MG tablet Take 4 mg by mouth at bedtime.    [place] Kitchen guaiFENesin (MUCINEX) 600 MG 12 hr tablet Take 1,200 mg by mouth 2 (two) times daily as needed for to loosen phlegm.    Boris Lown Oil Omega-3 500 MG CAPS Take 500 mg by mouth daily.    [place] Kitchen levothyroxine (SYNTHROID) 25 MCG tablet Take 25 mcg by mouth daily before breakfast.    .  metFORMIN (GLUCOPHAGE) 500 MG tablet Take 500 mg by mouth 2 (two) times daily with a meal.    . metoprolol succinate (TOPROL-XL) 50 MG 24 hr tablet Take 50 mg by mouth daily. Take with or immediately following a meal.    . Multiple Vitamins-Minerals (MULTIVITAMIN WITH MINERALS) tablet Take 1 tablet by mouth daily.    . pantoprazole (PROTONIX) 40 MG tablet Take 40 mg by mouth daily.    . simvastatin (ZOCOR) 20 MG tablet Take 20 mg by mouth at bedtime.    . vitamin B-12 (CYANOCOBALAMIN) 1000 MCG tablet Take 1,000 mcg by mouth daily.     No current facility-administered medications for this visit.    Physical Exam: Vitals:   06/07/20 1153  BP: 118/64  Pulse: 87  SpO2: 97%  Weight: 160 lb 9.6 oz (72.8 kg)  Height: 5\' 8"  (1.727 m)    GEN- The patient is well appearing, alert and oriented x 3 today.   Head- normocephalic, atraumatic Eyes-  Sclera clear, conjunctiva pink Ears- hearing intact Oropharynx- clear Lungs-  , normal work of breathing Heart- irregular rate and rhythm  GI- soft  Extremities- no clubbing, cyanosis, or edema  Wt Readings from Last 3 Encounters:  06/07/20 160 lb 9.6 oz (72.8 kg)  05/06/20 156 lb 6.4 oz (70.9 kg)  04/30/20 160 lb (72.6 kg)    EKG tracing ordered today is personally reviewed  and shows afib  Assessment and Plan:  1. Persistent atrial fibrillation The patient has symptomatic, recurrent atrial fibrillation.  Chads2vasc score is 5.  he is anticoagulated with eliquis. Therapeutic strategies for afib including medicine and ablation were discussed in detail with the patient today. Risk, benefits, and alternatives to EP study and radiofrequency ablation for afib were also discussed in detail today. These risks include but are not limited to stroke, bleeding, vascular damage, tamponade, perforation, damage to the esophagus, lungs, and other structures, pulmonary vein stenosis, worsening renal function, and death. The patient understands these risk and  wishes to proceed.  We will therefore proceed with catheter ablation at the next available time.  Carto, ICE, anesthesia are requested for the procedure.  Will also obtain cardiac CT prior to the procedure to exclude LAA thrombus and further evaluate atrial anatomy.  2. HTN Stable No change required today  3. CAD s/p CABG Stable No change required today   Risks, benefits and potential toxicities for medications prescribed and/or refilled reviewed with patient today.   Hillis Range MD, [facility] 06/07/2020 12:14 PM

## 2020-06-07 NOTE — Patient Instructions (Signed)
Medication Instructions:  Your physician recommends that you continue on your current medications as directed. Please refer to the Current Medication list given to you today.  Labwork: None ordered.  Testing/Procedures: None ordered.  Follow-Up:  SEE INSTRUCTION LETTER  Any Other Special Instructions Will Be Listed Below (If Applicable).  If you need a refill on your cardiac medications before your next appointment, please call your pharmacy.     Cardiac electrophysiology: From cell to bedside (7th ed., pp. 1239-1252). [place], [place]: Elsevier.">  Cardiac Ablation Cardiac ablation is a procedure to destroy, or ablate, a small amount of heart tissue in very specific places. The heart has many electrical connections. Sometimes these connections are abnormal and can cause the heart to beat very fast or irregularly. Ablating some of the areas that cause problems can improve the heart's rhythm or return it to normal. Ablation may be done for people who:  Have Wolff-Parkinson-White syndrome.  Have fast heart rhythms (tachycardia).  Have taken medicines for an abnormal heart rhythm (arrhythmia) that were not effective or caused side effects.  Have a high-risk heartbeat that may be life-threatening. During the procedure, a small incision is made in the neck or the groin, and a long, thin tube (catheter) is inserted into the incision and moved to the heart. Small devices (electrodes) on the tip of the catheter will send out electrical currents. A type of X-ray (fluoroscopy) will be used to help guide the catheter and to provide images of the heart. Tell a health care provider about:  Any allergies you have.  All medicines you are taking, including vitamins, herbs, eye drops, creams, and over-the-counter medicines.  Any problems you or family members have had with anesthetic medicines.  Any blood disorders you have.  Any surgeries you have had.  Any medical conditions you have,  such as kidney failure.  Whether you are pregnant or may be pregnant. What are the risks? Generally, this is a safe procedure. However, problems may occur, including:  Infection.  Bruising and bleeding at the catheter insertion site.  Bleeding into the chest, especially into the sac that surrounds the heart. This is a serious complication.  Stroke or blood clots.  Damage to nearby structures or organs.  Allergic reaction to medicines or dyes.  Need for a permanent pacemaker if the normal electrical system is damaged. A pacemaker is a small computer that sends electrical signals to the heart and helps your heart beat normally.  The procedure not being fully effective. This may not be recognized until months later. Repeat ablation procedures are sometimes done. What happens before the procedure? Medicines Ask your health care provider about:  Changing or stopping your regular medicines. This is especially important if you are taking diabetes medicines or blood thinners.  Taking medicines such as aspirin and ibuprofen. These medicines can thin your blood. Do not take these medicines unless your health care provider tells you to take them.  Taking over-the-counter medicines, vitamins, herbs, and supplements. General instructions  Follow instructions from your health care provider about eating or drinking restrictions.  Plan to have someone take you home from the hospital or clinic.  If you will be going home right after the procedure, plan to have someone with you for 24 hours.  Ask your health care provider what steps will be taken to prevent infection. What happens during the procedure?  An IV will be inserted into one of your veins.  You will be given a medicine to help you relax (  sedative).  The skin on your neck or groin will be numbed.  An incision will be made in your neck or your groin.  A needle will be inserted through the incision and into a large vein in your  neck or groin.  A catheter will be inserted into the needle and moved to your heart.  Dye may be injected through the catheter to help your surgeon see the area of the heart that needs treatment.  Electrical currents will be sent from the catheter to ablate heart tissue in desired areas. There are three types of energy that may be used to do this: ? Heat (radiofrequency energy). ? Laser energy. ? Extreme cold (cryoablation).  When the tissue has been ablated, the catheter will be removed.  Pressure will be held on the insertion area to prevent a lot of bleeding.  A bandage (dressing) will be placed over the insertion area. The exact procedure may vary among health care providers and hospitals.   What happens after the procedure?  Your blood pressure, heart rate, breathing rate, and blood oxygen level will be monitored until you leave the hospital or clinic.  Your insertion area will be monitored for bleeding. You will need to lie still for a few hours to ensure that you do not bleed from the insertion area.  Do not drive for 24 hours or as long as told by your health care provider. Summary  Cardiac ablation is a procedure to destroy, or ablate, a small amount of heart tissue using an electrical current. This procedure can improve the heart rhythm or return it to normal.  Tell your health care provider about any medical conditions you may have and all medicines you are taking to treat them.  This is a safe procedure, but problems may occur. Problems may include infection, bruising, damage to nearby organs or structures, or allergic reactions to medicines.  Follow your health care provider's instructions about eating and drinking before the procedure. You may also be told to change or stop some of your medicines.  After the procedure, do not drive for 24 hours or as long as told by your health care provider. This information is not intended to replace advice given to you by your  health care provider. Make sure you discuss any questions you have with your health care provider. Document Revised: 01/27/2019 Document Reviewed: 01/27/2019 Elsevier Patient Education  2021 Elsevier Inc.      

## 2020-06-07 NOTE — H&P (View-Only) (Signed)
PCP: Glori Bickers, MD   Primary EP: Dr Johney Frame  Thomas Simon is a 75 y.o. male who presents today for routine electrophysiology followup.  Since last being seen in our clinic, the patient reports doing reasonably well.  He continues to struggle with afib.  Recent cardioversion was not effective as he quickly returned to afib.  + fatigue and decreased exercise tolerance. Today, he denies symptoms of palpitations, chest pain, shortness of breath,  lower extremity edema, dizziness, presyncope, or syncope.  The patient is otherwise without complaint today.   Past Medical History:  Diagnosis Date  . Coronary arteriosclerosis   . Diabetes mellitus without complication (HCC)   . Hyperlipidemia   . Hypertension   . Persistent atrial fibrillation (HCC)   . PVD (peripheral vascular disease) (HCC)   . Thyroid disease    Past Surgical History:  Procedure Laterality Date  . CARDIOVERSION N/A 04/30/2020   Procedure: CARDIOVERSION;  Surgeon: Chilton Si, MD;  Location: [facility] ENDOSCOPY;  Service: Cardiovascular;  Laterality: N/A;  . CHOLECYSTECTOMY    . KNEE ARTHROSCOPY W/ PARTIAL MEDIAL MENISCECTOMY    . open heart surgery    . ROTATOR CUFF REPAIR      ROS- all systems are reviewed and negatives except as per HPI above  Current Outpatient Medications  Medication Sig Dispense Refill  . apixaban (ELIQUIS) 5 MG TABS tablet Take 5 mg by mouth 2 (two) times daily.    . Calcium-Magnesium-Zinc (CAL-MAG-ZINC PO) Take 1 tablet by mouth 3 (three) times daily.    . Cholecalciferol (VITAMIN D3) 50 MCG (2000 UT) TABS Take 2,000 Units by mouth daily.    [place] Kitchen doxazosin (CARDURA) 4 MG tablet Take 4 mg by mouth at bedtime.    [place] Kitchen guaiFENesin (MUCINEX) 600 MG 12 hr tablet Take 1,200 mg by mouth 2 (two) times daily as needed for to loosen phlegm.    Boris Lown Oil Omega-3 500 MG CAPS Take 500 mg by mouth daily.    [place] Kitchen levothyroxine (SYNTHROID) 25 MCG tablet Take 25 mcg by mouth daily before breakfast.    .  metFORMIN (GLUCOPHAGE) 500 MG tablet Take 500 mg by mouth 2 (two) times daily with a meal.    . metoprolol succinate (TOPROL-XL) 50 MG 24 hr tablet Take 50 mg by mouth daily. Take with or immediately following a meal.    . Multiple Vitamins-Minerals (MULTIVITAMIN WITH MINERALS) tablet Take 1 tablet by mouth daily.    . pantoprazole (PROTONIX) 40 MG tablet Take 40 mg by mouth daily.    . simvastatin (ZOCOR) 20 MG tablet Take 20 mg by mouth at bedtime.    . vitamin B-12 (CYANOCOBALAMIN) 1000 MCG tablet Take 1,000 mcg by mouth daily.     No current facility-administered medications for this visit.    Physical Exam: Vitals:   06/07/20 1153  BP: 118/64  Pulse: 87  SpO2: 97%  Weight: 160 lb 9.6 oz (72.8 kg)  Height: 5\' 8"  (1.727 m)    GEN- The patient is well appearing, alert and oriented x 3 today.   Head- normocephalic, atraumatic Eyes-  Sclera clear, conjunctiva pink Ears- hearing intact Oropharynx- clear Lungs-  , normal work of breathing Heart- irregular rate and rhythm  GI- soft  Extremities- no clubbing, cyanosis, or edema  Wt Readings from Last 3 Encounters:  06/07/20 160 lb 9.6 oz (72.8 kg)  05/06/20 156 lb 6.4 oz (70.9 kg)  04/30/20 160 lb (72.6 kg)    EKG tracing ordered today is personally reviewed  and shows afib  Assessment and Plan:  1. Persistent atrial fibrillation The patient has symptomatic, recurrent atrial fibrillation.  Chads2vasc score is 5.  he is anticoagulated with eliquis. Therapeutic strategies for afib including medicine and ablation were discussed in detail with the patient today. Risk, benefits, and alternatives to EP study and radiofrequency ablation for afib were also discussed in detail today. These risks include but are not limited to stroke, bleeding, vascular damage, tamponade, perforation, damage to the esophagus, lungs, and other structures, pulmonary vein stenosis, worsening renal function, and death. The patient understands these risk and  wishes to proceed.  We will therefore proceed with catheter ablation at the next available time.  Carto, ICE, anesthesia are requested for the procedure.  Will also obtain cardiac CT prior to the procedure to exclude LAA thrombus and further evaluate atrial anatomy.  2. HTN Stable No change required today  3. CAD s/p CABG Stable No change required today   Risks, benefits and potential toxicities for medications prescribed and/or refilled reviewed with patient today.   Enis Riecke MD, FACC 06/07/2020 12:14 PM     

## 2020-06-08 ENCOUNTER — Ambulatory Visit (HOSPITAL_COMMUNITY)
Admission: RE | Admit: 2020-06-08 | Discharge: 2020-06-08 | Disposition: A | Discharge status: Home / Self Care | Payer: Medicare Other | Source: Ambulatory Visit | Attending: Internal Medicine | Admitting: Internal Medicine

## 2020-06-08 DIAGNOSIS — I4891 Unspecified atrial fibrillation: Secondary | ICD-10-CM | POA: Diagnosis not present

## 2020-06-08 MED ORDER — IOHEXOL 350 MG/ML SOLN
80.0000 mL | Freq: Once | INTRAVENOUS | Status: AC | PRN
  Administered 2020-06-08: 80 mL via INTRAVENOUS

## 2020-06-14 ENCOUNTER — Other Ambulatory Visit (HOSPITAL_COMMUNITY)
Admission: RE | Admit: 2020-06-14 | Discharge: 2020-06-14 | Disposition: A | Discharge status: Home / Self Care | Payer: Medicare Other | Source: Ambulatory Visit | Attending: Internal Medicine | Admitting: Internal Medicine

## 2020-06-14 DIAGNOSIS — Z20822 Contact with and (suspected) exposure to covid-19: Secondary | ICD-10-CM | POA: Diagnosis not present

## 2020-06-14 DIAGNOSIS — Z01812 Encounter for preprocedural laboratory examination: Secondary | ICD-10-CM | POA: Diagnosis present

## 2020-06-14 LAB — SARS CORONAVIRUS 2 (TAT 6-24 HRS): SARS Coronavirus 2: NEGATIVE

## 2020-06-14 NOTE — Anesthesia Preprocedure Evaluation (Addendum)
Anesthesia Evaluation  Patient identified by MRN, date of birth, ID band Patient awake    Reviewed: Allergy & Precautions, NPO status , Patient's Chart, lab work & pertinent test results  Airway Mallampati: II  TM Distance: >3 FB Neck ROM: Full    Dental  (+) Partial Upper, Edentulous Lower, Dental Advisory Given   Pulmonary neg pulmonary ROS, former smoker,    Pulmonary exam normal breath sounds clear to auscultation       Cardiovascular hypertension, Pt. on home beta blockers and Pt. on medications + CAD and + Peripheral Vascular Disease  Normal cardiovascular exam+ dysrhythmias Atrial Fibrillation  Rhythm:Regular Rate:Normal     Neuro/Psych negative neurological ROS     GI/Hepatic negative GI ROS, Neg liver ROS,   Endo/Other  diabetes  Renal/GU negative Renal ROS     Musculoskeletal negative musculoskeletal ROS (+)   Abdominal   Peds  Hematology negative hematology ROS (+)   Anesthesia Other Findings   Reproductive/Obstetrics                           Anesthesia Physical Anesthesia Plan  ASA: III  Anesthesia Plan: General   Post-op Pain Management:    Induction: Intravenous  PONV Risk Score and Plan: 3 and Ondansetron, Dexamethasone, Scopolamine patch - Pre-op, Treatment may vary due to age or medical condition and Diphenhydramine  Airway Management Planned: Oral ETT  Additional Equipment:   Intra-op Plan:   Post-operative Plan: Extubation in OR  Informed Consent: I have reviewed the patients History and Physical, chart, labs and discussed the procedure including the risks, benefits and alternatives for the proposed anesthesia with the patient or authorized representative who has indicated his/her understanding and acceptance.     Dental advisory given  Plan Discussed with: CRNA  Anesthesia Plan Comments:        Anesthesia Quick Evaluation

## 2020-06-14 NOTE — Pre-Procedure Instructions (Signed)
Attempted to call patient regarding procedure instructions.  Left voicemail on the following items.  Instructed patient on the following items: Arrival time 0530 Nothing to eat or drink after midnight No meds AM of procedure Responsible person to drive you home and stay with you for 24 hrs  Have you missed any doses of anti-coagulant Eliquis- take both doses today, none in the morning.

## 2020-06-15 ENCOUNTER — Ambulatory Visit (HOSPITAL_COMMUNITY)
Admission: RE | Disposition: A | Discharge status: Home / Self Care | Payer: Self-pay | Source: Home / Self Care | Attending: Internal Medicine

## 2020-06-15 ENCOUNTER — Ambulatory Visit (HOSPITAL_COMMUNITY)
Admission: RE | Admit: 2020-06-15 | Discharge: 2020-06-15 | Disposition: A | Discharge status: Home / Self Care | Payer: Medicare Other | Attending: Internal Medicine | Admitting: Internal Medicine

## 2020-06-15 ENCOUNTER — Other Ambulatory Visit: Payer: Self-pay

## 2020-06-15 ENCOUNTER — Ambulatory Visit (HOSPITAL_COMMUNITY): Payer: Medicare Other | Admitting: Certified Registered"

## 2020-06-15 ENCOUNTER — Encounter (HOSPITAL_COMMUNITY): Payer: Self-pay | Admitting: Internal Medicine

## 2020-06-15 DIAGNOSIS — I4819 Other persistent atrial fibrillation: Secondary | ICD-10-CM | POA: Diagnosis not present

## 2020-06-15 DIAGNOSIS — I2510 Atherosclerotic heart disease of native coronary artery without angina pectoris: Secondary | ICD-10-CM | POA: Insufficient documentation

## 2020-06-15 DIAGNOSIS — Z79890 Hormone replacement therapy (postmenopausal): Secondary | ICD-10-CM | POA: Insufficient documentation

## 2020-06-15 DIAGNOSIS — Z7984 Long term (current) use of oral hypoglycemic drugs: Secondary | ICD-10-CM | POA: Diagnosis not present

## 2020-06-15 DIAGNOSIS — Z951 Presence of aortocoronary bypass graft: Secondary | ICD-10-CM | POA: Insufficient documentation

## 2020-06-15 DIAGNOSIS — Z79899 Other long term (current) drug therapy: Secondary | ICD-10-CM | POA: Diagnosis not present

## 2020-06-15 DIAGNOSIS — Z7901 Long term (current) use of anticoagulants: Secondary | ICD-10-CM | POA: Diagnosis not present

## 2020-06-15 DIAGNOSIS — I10 Essential (primary) hypertension: Secondary | ICD-10-CM | POA: Diagnosis not present

## 2020-06-15 HISTORY — PX: ATRIAL FIBRILLATION ABLATION: EP1191

## 2020-06-15 LAB — GLUCOSE, CAPILLARY
Glucose-Capillary: 148 mg/dL — ABNORMAL HIGH (ref 70–99)
Glucose-Capillary: 171 mg/dL — ABNORMAL HIGH (ref 70–99)

## 2020-06-15 SURGERY — ATRIAL FIBRILLATION ABLATION
Anesthesia: General

## 2020-06-15 MED ORDER — DEXAMETHASONE SODIUM PHOSPHATE 10 MG/ML IJ SOLN
INTRAMUSCULAR | Status: DC | PRN
  Administered 2020-06-15: 10 mg via INTRAVENOUS

## 2020-06-15 MED ORDER — PROPOFOL 10 MG/ML IV BOLUS
INTRAVENOUS | Status: DC | PRN
  Administered 2020-06-15: 100 mg via INTRAVENOUS

## 2020-06-15 MED ORDER — FENTANYL CITRATE (PF) 250 MCG/5ML IJ SOLN
INTRAMUSCULAR | Status: DC | PRN
  Administered 2020-06-15 (×2): 50 ug via INTRAVENOUS

## 2020-06-15 MED ORDER — ONDANSETRON HCL 4 MG/2ML IJ SOLN
INTRAMUSCULAR | Status: DC | PRN
  Administered 2020-06-15: 4 mg via INTRAVENOUS

## 2020-06-15 MED ORDER — APIXABAN 5 MG PO TABS
5.0000 mg | ORAL_TABLET | ORAL | Status: AC
  Administered 2020-06-15: 5 mg via ORAL
  Filled 2020-06-15: qty 1

## 2020-06-15 MED ORDER — HEPARIN SODIUM (PORCINE) 1000 UNIT/ML IJ SOLN
INTRAMUSCULAR | Status: DC | PRN
  Administered 2020-06-15: 1000 [IU] via INTRAVENOUS
  Administered 2020-06-15: 15000 [IU] via INTRAVENOUS

## 2020-06-15 MED ORDER — SUGAMMADEX SODIUM 200 MG/2ML IV SOLN
INTRAVENOUS | Status: DC | PRN
  Administered 2020-06-15: 200 mg via INTRAVENOUS

## 2020-06-15 MED ORDER — ACETAMINOPHEN 325 MG PO TABS
650.0000 mg | ORAL_TABLET | ORAL | Status: DC | PRN
  Filled 2020-06-15: qty 2

## 2020-06-15 MED ORDER — HEPARIN (PORCINE) IN NACL 1000-0.9 UT/500ML-% IV SOLN
INTRAVENOUS | Status: AC
  Filled 2020-06-15: qty 1500

## 2020-06-15 MED ORDER — ESMOLOL HCL 100 MG/10ML IV SOLN
INTRAVENOUS | Status: DC | PRN
  Administered 2020-06-15: 40 mg via INTRAVENOUS

## 2020-06-15 MED ORDER — HEPARIN SODIUM (PORCINE) 1000 UNIT/ML IJ SOLN
INTRAMUSCULAR | Status: AC
  Filled 2020-06-15: qty 1

## 2020-06-15 MED ORDER — PHENYLEPHRINE HCL-NACL 10-0.9 MG/250ML-% IV SOLN
INTRAVENOUS | Status: DC | PRN
  Administered 2020-06-15: 30 ug/min via INTRAVENOUS

## 2020-06-15 MED ORDER — ROCURONIUM BROMIDE 10 MG/ML (PF) SYRINGE
PREFILLED_SYRINGE | INTRAVENOUS | Status: DC | PRN
  Administered 2020-06-15: 40 mg via INTRAVENOUS
  Administered 2020-06-15: 10 mg via INTRAVENOUS
  Administered 2020-06-15: 50 mg via INTRAVENOUS

## 2020-06-15 MED ORDER — SODIUM CHLORIDE 0.9% FLUSH: 3.0000 mL | Freq: Two times a day (BID) | INTRAVENOUS | Status: DC

## 2020-06-15 MED ORDER — ONDANSETRON HCL 4 MG/2ML IJ SOLN: 4.0000 mg | Freq: Four times a day (QID) | INTRAMUSCULAR | Status: DC | PRN

## 2020-06-15 MED ORDER — LIDOCAINE 2% (20 MG/ML) 5 ML SYRINGE
INTRAMUSCULAR | Status: DC | PRN
  Administered 2020-06-15: 70 mg via INTRAVENOUS

## 2020-06-15 MED ORDER — SODIUM CHLORIDE 0.9 % IV SOLN: INTRAVENOUS | Status: DC

## 2020-06-15 MED ORDER — PHENYLEPHRINE 40 MCG/ML (10ML) SYRINGE FOR IV PUSH (FOR BLOOD PRESSURE SUPPORT)
PREFILLED_SYRINGE | INTRAVENOUS | Status: DC | PRN
  Administered 2020-06-15 (×3): 80 ug via INTRAVENOUS

## 2020-06-15 MED ORDER — PROTAMINE SULFATE 10 MG/ML IV SOLN
INTRAVENOUS | Status: DC | PRN
  Administered 2020-06-15: 30 mg via INTRAVENOUS
  Administered 2020-06-15: 10 mg via INTRAVENOUS

## 2020-06-15 MED ORDER — SODIUM CHLORIDE 0.9% FLUSH: 3.0000 mL | INTRAVENOUS | Status: DC | PRN

## 2020-06-15 MED ORDER — HEPARIN SODIUM (PORCINE) 1000 UNIT/ML IJ SOLN
INTRAMUSCULAR | Status: DC | PRN
  Administered 2020-06-15: 5000 [IU] via INTRAVENOUS
  Administered 2020-06-15: 4000 [IU] via INTRAVENOUS

## 2020-06-15 MED ORDER — HEPARIN (PORCINE) IN NACL 1000-0.9 UT/500ML-% IV SOLN
INTRAVENOUS | Status: DC | PRN
  Administered 2020-06-15: 500 mL

## 2020-06-15 MED ORDER — SODIUM CHLORIDE 0.9 % IV SOLN: 250.0000 mL | INTRAVENOUS | Status: DC | PRN

## 2020-06-15 SURGICAL SUPPLY — 18 items
BLANKET WARM UNDERBOD FULL ACC (MISCELLANEOUS) ×2 IMPLANT
CATH 8FR REPROCESSED SOUNDSTAR (CATHETERS) ×2 IMPLANT
CATH MAPPNG PENTARAY F 2-6-2MM (CATHETERS) ×1 IMPLANT
CATH SMTCH THERMOCOOL SF DF (CATHETERS) ×2 IMPLANT
CATH WEBSTER BI DIR CS D-F CRV (CATHETERS) ×2 IMPLANT
CLOSURE PERCLOSE PROSTYLE (VASCULAR PRODUCTS) ×6 IMPLANT
COVER SWIFTLINK CONNECTOR (BAG) ×2 IMPLANT
NEEDLE BAYLIS TRANSSEPTAL 71CM (NEEDLE) ×2 IMPLANT
PACK EP LATEX FREE (CUSTOM PROCEDURE TRAY) ×2
PACK EP LF (CUSTOM PROCEDURE TRAY) ×1 IMPLANT
PAD PRO RADIOLUCENT 2001M-C (PAD) ×2 IMPLANT
PATCH CARTO3 (PAD) ×2 IMPLANT
PENTARAY F 2-6-2MM (CATHETERS) ×2
SHEATH PINNACLE 7F 10CM (SHEATH) ×4 IMPLANT
SHEATH PINNACLE 9F 10CM (SHEATH) ×2 IMPLANT
SHEATH PROBE COVER 6X72 (BAG) ×2 IMPLANT
SHEATH SWARTZ TS SL2 63CM 8.5F (SHEATH) ×2 IMPLANT
TUBING SMART ABLATE COOLFLOW (TUBING) ×2 IMPLANT

## 2020-06-15 NOTE — Transfer of Care (Signed)
Immediate Anesthesia Transfer of Care Note  Patient: Thomas Simon  Procedure(s) Performed: ATRIAL FIBRILLATION ABLATION (N/A )  Patient Location: Cath Lab  Anesthesia Type:General  Level of Consciousness: awake, alert  and oriented  Airway & Oxygen Therapy: Patient Spontanous Breathing and Patient connected to nasal cannula oxygen  Post-op Assessment: Report given to RN and Post -op Vital signs reviewed and stable  Post vital signs: Reviewed and stable  Last Vitals:  Vitals Value Taken Time  BP 107/60 06/15/20 1008  Temp    Pulse 71 06/15/20 1008  Resp 16 06/15/20 1008  SpO2 98 % 06/15/20 1008  Vitals shown include unvalidated device data.  Last Pain:  Vitals:   06/15/20 0539  TempSrc:   PainSc: 0-No pain      Patients Stated Pain Goal: 3 (06/15/20 0539)  Complications: No complications documented.

## 2020-06-15 NOTE — Progress Notes (Signed)
Dr. Johney Frame by to see and speak w/patient; aware of c/o chest pressure.

## 2020-06-15 NOTE — Interval H&P Note (Signed)
History and Physical Interval Note:  06/15/2020 7:08 AM  Thomas Simon  has presented today for surgery, with the diagnosis of afib.  The various methods of treatment have been discussed with the patient and family. After consideration of risks, benefits and other options for treatment, the patient has consented to  Procedure(s): ATRIAL FIBRILLATION ABLATION (N/A) as a surgical intervention.  The patient's history has been reviewed, patient examined, no change in status, stable for surgery.  I have reviewed the patient's chart and labs.  Questions were answered to the patient's satisfaction.    He reports compliance with eliquis without interruption.  Cardiac CT reviewed with him today.  Risk, benefits, and alternatives to EP study and radiofrequency ablation for afib were also discussed in detail today. These risks include but are not limited to stroke, bleeding, vascular damage, tamponade, perforation, damage to the esophagus, lungs, and other structures, pulmonary vein stenosis, worsening renal function, and death. The patient understands these risk and wishes to proceed.     Hillis Range

## 2020-06-15 NOTE — Discharge Instructions (Signed)
Post procedure care instructions No driving for 4 days. No lifting over 5 lbs for 1 week. No vigorous or sexual activity for 1 week. You may return to work/your usual activities on 06/23/20. Keep procedure site clean & dry. If you notice increased pain, swelling, bleeding or pus, call/return!  You may shower after 24 hours, but no soaking in baths/hot tubs/pools for 1 week.      Cardiac Ablation, Care After  This sheet gives you information about how to care for yourself after your procedure. Your health care provider may also give you more specific instructions. If you have problems or questions, contact your health care provider. What can I expect after the procedure? After the procedure, it is common to have:  Bruising around your puncture site.  Tenderness around your puncture site.  Skipped heartbeats.  Tiredness (fatigue).  Follow these instructions at home: Puncture site care   Follow instructions from your health care provider about how to take care of your puncture site. Make sure you: ? If present, leave stitches (sutures), skin glue, or adhesive strips in place. These skin closures may need to stay in place for up to 2 weeks. If adhesive strip edges start to loosen and curl up, you may trim the loose edges. Do not remove adhesive strips completely unless your health care provider tells you to do that. ? If a square bandage is present, this may be removed in 24 hours.   Check your puncture site every day for signs of infection. Check for: ? Redness, swelling, or pain. ? Fluid or blood. If your puncture site starts to bleed, lie down on your back, apply firm pressure to the area, and contact your health care provider. ? Warmth. ? Pus or a bad smell. Driving  Do not drive for at least 4 days after your procedure or however long your health care provider recommends. (Do not resume driving if you have previously been instructed not to drive for other health reasons.)  Do not  drive or use heavy machinery while taking prescription pain medicine. Activity  Avoid activities that take a lot of effort for at least 7 days after your procedure.  Do not lift anything that is heavier than 5 lb (4.5 kg) for one week.   No sexual activity for 1 week.   Return to your normal activities as told by your health care provider. Ask your health care provider what activities are safe for you. General instructions  Take over-the-counter and prescription medicines only as told by your health care provider.  Do not use any products that contain nicotine or tobacco, such as cigarettes and e-cigarettes. If you need help quitting, ask your health care provider.  You may shower after 24 hours, but Do not take baths, swim, or use a hot tub for 1 week.   Do not drink alcohol for 24 hours after your procedure.  Keep all follow-up visits as told by your health care provider. This is important. Contact a health care provider if:  You have redness, mild swelling, or pain around your puncture site.  You have fluid or blood coming from your puncture site that stops after applying firm pressure to the area.  Your puncture site feels warm to the touch.  You have pus or a bad smell coming from your puncture site.  You have a fever.  You have chest pain or discomfort that spreads to your neck, jaw, or arm.  You are sweating a lot.  You  feel nauseous.  You have a fast or irregular heartbeat.  You have shortness of breath.  You are dizzy or light-headed and feel the need to lie down.  You have pain or numbness in the arm or leg closest to your puncture site. Get help right away if:  Your puncture site suddenly swells.  Your puncture site is bleeding and the bleeding does not stop after applying firm pressure to the area. These symptoms may represent a serious problem that is an emergency. Do not wait to see if the symptoms will go away. Get medical help right away. Call your  local emergency services (911 in the [place] Do not drive yourself to the hospital. Summary  After the procedure, it is normal to have bruising and tenderness at the puncture site in your groin, neck, or forearm.  Check your puncture site every day for signs of infection.  Get help right away if your puncture site is bleeding and the bleeding does not stop after applying firm pressure to the area. This is a medical emergency. This information is not intended to replace advice given to you by your health care provider. Make sure you discuss any questions you have with your health care provider.    You have an appointment set up with the Atrial Fibrillation Clinic.  Multiple studies have shown that being followed by a dedicated atrial fibrillation clinic in addition to the standard care you receive from your other physicians improves health. We believe that enrollment in the atrial fibrillation clinic will allow [place] to better care for you.   The phone number to the Atrial Fibrillation Clinic is [phone]. The clinic is staffed Monday through Friday from 8:30am to 5pm.  Parking Directions: The clinic is located in the Heart and Vascular Building connected to [facility]. 1)From [address] turn on to [address] and go to the 3rd entrance  (Heart and Vascular entrance) on the right. 2)Look to the right for Heart &Vascular Parking Garage. 3)A code for the entrance is required, for April is 2231.   4)Take the elevators to the 1st floor. Registration is in the room with the glass walls at the end of the hallway.  If you have any trouble parking or locating the clinic, please don't hesitate to call [phone].

## 2020-06-15 NOTE — Anesthesia Procedure Notes (Signed)
Procedure Name: Intubation Date/Time: 06/15/2020 7:40 AM Performed by: Griffin Dakin, CRNA Pre-anesthesia Checklist: Patient identified, Emergency Drugs available, Suction available and Patient being monitored Patient Re-evaluated:Patient Re-evaluated prior to induction Oxygen Delivery Method: Circle system utilized Preoxygenation: Pre-oxygenation with 100% oxygen Induction Type: IV induction Ventilation: Mask ventilation without difficulty Laryngoscope Size: Mac and 4 Grade View: Grade I Tube type: Oral Tube size: 7.5 mm Number of attempts: 1 Airway Equipment and Method: Stylet and Oral airway Placement Confirmation: ETT inserted through vocal cords under direct vision,  positive ETCO2 and breath sounds checked- equal and bilateral Secured at: 23 cm Tube secured with: Tape Dental Injury: Teeth and Oropharynx as per pre-operative assessment

## 2020-06-16 LAB — POCT ACTIVATED CLOTTING TIME
Activated Clotting Time: 255 seconds
Activated Clotting Time: 267 seconds
Activated Clotting Time: 374 seconds

## 2020-06-16 MED FILL — Heparin Sod (Porcine)-NaCl IV Soln 1000 Unit/500ML-0.9%: INTRAVENOUS | Qty: 500 | Status: AC

## 2020-06-16 NOTE — Anesthesia Postprocedure Evaluation (Signed)
Anesthesia Post Note  Patient: Thomas Simon  Procedure(s) Performed: ATRIAL FIBRILLATION ABLATION (N/A )     Patient location during evaluation: PACU Anesthesia Type: General Level of consciousness: sedated and patient cooperative Pain management: pain level controlled Vital Signs Assessment: post-procedure vital signs reviewed and stable Respiratory status: spontaneous breathing Cardiovascular status: stable Anesthetic complications: no   No complications documented.  Last Vitals:  Vitals:   06/15/20 1300 06/15/20 1400  BP: (!) 141/65 (!) 166/63  Pulse: 73 75  Resp: 12 16  Temp:    SpO2: 97% 98%    Last Pain:  Vitals:   06/15/20 1051  TempSrc:   PainSc: 0-No pain                 Lewie Loron

## 2020-06-17 ENCOUNTER — Telehealth (HOSPITAL_COMMUNITY): Payer: Self-pay | Admitting: *Deleted

## 2020-06-21 ENCOUNTER — Ambulatory Visit (HOSPITAL_COMMUNITY)
Admission: RE | Admit: 2020-06-21 | Discharge: 2020-06-21 | Disposition: A | Discharge status: Home / Self Care | Payer: Medicare Other | Source: Ambulatory Visit | Attending: Physician Assistant | Admitting: Physician Assistant

## 2020-06-21 ENCOUNTER — Encounter (HOSPITAL_COMMUNITY): Payer: Self-pay | Admitting: Physician Assistant

## 2020-06-21 ENCOUNTER — Other Ambulatory Visit: Payer: Self-pay

## 2020-06-21 ENCOUNTER — Other Ambulatory Visit (HOSPITAL_COMMUNITY)
Admission: RE | Admit: 2020-06-21 | Discharge: 2020-06-21 | Disposition: A | Discharge status: Home / Self Care | Payer: Medicare Other | Source: Ambulatory Visit | Attending: Physician Assistant | Admitting: Physician Assistant

## 2020-06-21 ENCOUNTER — Other Ambulatory Visit (HOSPITAL_COMMUNITY): Payer: Self-pay | Admitting: *Deleted

## 2020-06-21 VITALS — BP 124/82 | HR 107 | Ht 68.0 in | Wt 160.8 lb

## 2020-06-21 DIAGNOSIS — Z87891 Personal history of nicotine dependence: Secondary | ICD-10-CM | POA: Diagnosis not present

## 2020-06-21 DIAGNOSIS — Z886 Allergy status to analgesic agent status: Secondary | ICD-10-CM | POA: Diagnosis not present

## 2020-06-21 DIAGNOSIS — I4819 Other persistent atrial fibrillation: Secondary | ICD-10-CM | POA: Insufficient documentation

## 2020-06-21 DIAGNOSIS — Z888 Allergy status to other drugs, medicaments and biological substances status: Secondary | ICD-10-CM | POA: Diagnosis not present

## 2020-06-21 DIAGNOSIS — E039 Hypothyroidism, unspecified: Secondary | ICD-10-CM | POA: Diagnosis not present

## 2020-06-21 DIAGNOSIS — Z01812 Encounter for preprocedural laboratory examination: Secondary | ICD-10-CM | POA: Insufficient documentation

## 2020-06-21 DIAGNOSIS — D6869 Other thrombophilia: Secondary | ICD-10-CM | POA: Diagnosis not present

## 2020-06-21 DIAGNOSIS — Z951 Presence of aortocoronary bypass graft: Secondary | ICD-10-CM | POA: Diagnosis not present

## 2020-06-21 DIAGNOSIS — Z79899 Other long term (current) drug therapy: Secondary | ICD-10-CM | POA: Diagnosis not present

## 2020-06-21 DIAGNOSIS — E119 Type 2 diabetes mellitus without complications: Secondary | ICD-10-CM | POA: Insufficient documentation

## 2020-06-21 DIAGNOSIS — Z79890 Hormone replacement therapy (postmenopausal): Secondary | ICD-10-CM | POA: Diagnosis not present

## 2020-06-21 DIAGNOSIS — Z7984 Long term (current) use of oral hypoglycemic drugs: Secondary | ICD-10-CM | POA: Insufficient documentation

## 2020-06-21 DIAGNOSIS — Z7901 Long term (current) use of anticoagulants: Secondary | ICD-10-CM | POA: Insufficient documentation

## 2020-06-21 DIAGNOSIS — I2510 Atherosclerotic heart disease of native coronary artery without angina pectoris: Secondary | ICD-10-CM | POA: Insufficient documentation

## 2020-06-21 DIAGNOSIS — Z20822 Contact with and (suspected) exposure to covid-19: Secondary | ICD-10-CM | POA: Insufficient documentation

## 2020-06-21 DIAGNOSIS — I10 Essential (primary) hypertension: Secondary | ICD-10-CM | POA: Diagnosis not present

## 2020-06-21 LAB — CBC
HCT: 34.6 % — ABNORMAL LOW (ref 39.0–52.0)
Hemoglobin: 10.8 g/dL — ABNORMAL LOW (ref 13.0–17.0)
MCH: 26 pg (ref 26.0–34.0)
MCHC: 31.2 g/dL (ref 30.0–36.0)
MCV: 83.4 fL (ref 80.0–100.0)
Platelets: 165 10*3/uL (ref 150–400)
RBC: 4.15 MIL/uL — ABNORMAL LOW (ref 4.22–5.81)
RDW: 15.9 % — ABNORMAL HIGH (ref 11.5–15.5)
WBC: 4.7 10*3/uL (ref 4.0–10.5)
nRBC: 0 % (ref 0.0–0.2)

## 2020-06-21 LAB — BASIC METABOLIC PANEL
Anion gap: 9 (ref 5–15)
BUN: 17 mg/dL (ref 8–23)
CO2: 25 mmol/L (ref 22–32)
Calcium: 9.1 mg/dL (ref 8.9–10.3)
Chloride: 99 mmol/L (ref 98–111)
Creatinine, Ser: 0.83 mg/dL (ref 0.61–1.24)
GFR, Estimated: >60 mL/min (ref >60)
Glucose, Bld: 188 mg/dL — ABNORMAL HIGH (ref 70–99)
Potassium: 4.2 mmol/L (ref 3.5–5.1)
Sodium: 133 mmol/L — ABNORMAL LOW (ref 135–145)

## 2020-06-21 NOTE — H&P (View-Only) (Signed)
Primary Care Physician: Glori Bickers, MD Primary Cardiologist: Dr Earna Coder Primary Electrophysiologist: Dr Johney Frame Referring Physician: Dr Johney Frame   Thomas Simon is a 75 y.o. male with a history of CAD s/p CABG, diabetes, HTN, hypothyroidism, and atrial fibrillation who presents for follow up in the [facility]. The patient was initially diagnosed with atrial fibrillation 02/2020 after presenting with symptoms of fatigue and SOB. Patient is on Eliquis for a CHADS2VASC score of 5. He underwent DCCV on 04/30/20. Unfortunately, he was back in afib before even leaving the hospital. He does note that he has more fatigue and SOB while in afib.  On follow up today, patient is s/p afib ablation 06/15/20. Unfortunately, he is back in afib about two days post ablation with symptoms of fatigue and lightheadedness. He does report that he felt much better (more energy) the day after ablation. He denies CP, swallowing pain, or groin issues. There does not appear to be any specific triggers.   Today, he denies symptoms of palpitations, chest pain, orthopnea, PND, lower extremity edema, presyncope, syncope, snoring, daytime somnolence, bleeding, or neurologic sequela. The patient is tolerating medications without difficulties and is otherwise without complaint today.    Atrial Fibrillation Risk Factors:  he does not have symptoms or diagnosis of sleep apnea. he does not have a history of rheumatic fever. he does not have a history of alcohol use.   he has a BMI of Body mass index is 24.45 kg/m.Marland Kitchen Filed Weights   06/21/20 0848  Weight: 72.9 kg    Family History  Problem Relation Age of Onset  . Heart failure Mother   . Lung disease Father      Atrial Fibrillation Management history:  Previous antiarrhythmic drugs: none Previous cardioversions: 04/30/20 Previous ablations: 06/15/20 CHADS2VASC score: 5 Anticoagulation history: Eliquis   Past Medical History:   Diagnosis Date  . Coronary arteriosclerosis   . Diabetes mellitus without complication (HCC)   . Hyperlipidemia   . Hypertension   . Persistent atrial fibrillation (HCC)   . PVD (peripheral vascular disease) (HCC)   . Thyroid disease    Past Surgical History:  Procedure Laterality Date  . ATRIAL FIBRILLATION ABLATION N/A 06/15/2020   Procedure: ATRIAL FIBRILLATION ABLATION;  Surgeon: Hillis Range, MD;  Location: [facility] INVASIVE CV LAB;  Service: Cardiovascular;  Laterality: N/A;  . CARDIOVERSION N/A 04/30/2020   Procedure: CARDIOVERSION;  Surgeon: Chilton Si, MD;  Location: [facility] ENDOSCOPY;  Service: Cardiovascular;  Laterality: N/A;  . CHOLECYSTECTOMY    . KNEE ARTHROSCOPY W/ PARTIAL MEDIAL MENISCECTOMY    . open heart surgery    . ROTATOR CUFF REPAIR      Current Outpatient Medications  Medication Sig Dispense Refill  . apixaban (ELIQUIS) 5 MG TABS tablet Take 5 mg by mouth 2 (two) times daily.    . Calcium-Magnesium-Zinc (CAL-MAG-ZINC PO) Take 1 tablet by mouth 3 (three) times daily.    . Cholecalciferol (VITAMIN D3) 50 MCG (2000 UT) TABS Take 2,000 Units by mouth daily.    Marland Kitchen doxazosin (CARDURA) 4 MG tablet Take 4 mg by mouth at bedtime.    Marland Kitchen guaiFENesin (MUCINEX) 600 MG 12 hr tablet Take 1,200 mg by mouth 2 (two) times daily as needed for to loosen phlegm.    Boris Lown Oil Omega-3 500 MG CAPS Take 500 mg by mouth daily.    Marland Kitchen levothyroxine (SYNTHROID) 25 MCG tablet Take 25 mcg by mouth daily before breakfast.    . metFORMIN (GLUCOPHAGE) 500 MG  tablet Take 500 mg by mouth 2 (two) times daily with a meal.    . metoprolol succinate (TOPROL-XL) 50 MG 24 hr tablet Take 50 mg by mouth daily. Take with or immediately following a meal.    . Multiple Vitamins-Minerals (MULTIVITAMIN WITH MINERALS) tablet Take 1 tablet by mouth daily.    . pantoprazole (PROTONIX) 40 MG tablet Take 40 mg by mouth daily.    . simvastatin (ZOCOR) 20 MG tablet Take 20 mg by mouth at bedtime.    . vitamin B-12  (CYANOCOBALAMIN) 1000 MCG tablet Take 1,000 mcg by mouth daily.     No current facility-administered medications for this encounter.    Allergies  Allergen Reactions  . Celecoxib Shortness Of Breath    unknown  . Fentanyl     rash  . Hydrocodone Other (See Comments)    urinary retention  . Tramadol Other (See Comments)    urinary retention    Social History   Socioeconomic History  . Marital status: Married    Spouse name: Not on file  . Number of children: Not on file  . Years of education: Not on file  . Highest education level: Not on file  Occupational History  . Not on file  Tobacco Use  . Smoking status: Former Smoker    Quit date: 07/16/2003    Years since quitting: 16.9  . Smokeless tobacco: Current User    Types: Chew  Vaping Use  . Vaping Use: Unknown  Substance and Sexual Activity  . Alcohol use: Never  . Drug use: Never  . Sexual activity: Not on file  Other Topics Concern  . Not on file  Social History Narrative  . Not on file   Social Determinants of Health   Financial Resource Strain: Not on file  Food Insecurity: Not on file  Transportation Needs: Not on file  Physical Activity: Not on file  Stress: Not on file  Social Connections: Not on file  Intimate Partner Violence: Not on file     ROS- All systems are reviewed and negative except as per the HPI above.  Physical Exam: Vitals:   06/21/20 0848  BP: 124/82  Pulse: (!) 107  Weight: 72.9 kg  Height: 5\' 8"  (1.727 m)    GEN- The patient is a well appearing elderly male, alert and oriented x 3 today.   HEENT-head normocephalic, atraumatic, sclera clear, conjunctiva pink, hearing intact, trachea midline. Lungs- Clear to ausculation bilaterally, normal work of breathing Heart- irregular rate and rhythm, no murmurs, rubs or gallops  GI- soft, NT, ND, + BS Extremities- no clubbing, cyanosis, or edema MS- no significant deformity or atrophy Skin- no rash or lesion Psych- euthymic mood,  full affect Neuro- strength and sensation are intact   Wt Readings from Last 3 Encounters:  06/21/20 72.9 kg  06/15/20 74.8 kg  06/07/20 72.8 kg    EKG today demonstrates  Afib, PVC Vent. rate 107 BPM PR interval * ms QRS duration 88 ms QT/QTc 334/445 ms  Epic records are reviewed at length today  CHA2DS2-VASc Score = 5  The patient's score is based upon: CHF History: No HTN History: Yes Diabetes History: Yes Stroke History: No Vascular Disease History: Yes Age Score: 2 Gender Score: 0      ASSESSMENT AND PLAN: 1. Persistent Atrial Fibrillation (ICD10:  I48.19) The patient's CHA2DS2-VASc score is 5, indicating a 7.2% annual risk of stroke.   S/p afib ablation 06/15/20 Patient back in afib today. Reassured patient  that breakthrough episodes of afib are common for the first 3 months post ablation. Will arrange for DCCV. Check bmet/CBC Continue Eliquis 5 mg BID Continue Toprol 50 mg daily  2. Secondary Hypercoagulable State (ICD10:  D68.69) The patient is at significant risk for stroke/thromboembolism based upon his CHA2DS2-VASc Score of 5.  Continue Apixaban (Eliquis).   3. CAD S/p CABG No anginal symptoms.  4. HTN Stable, no changes today.   Follow up in the AF clinic as scheduled post DCCV.   [name] PA-C Afib [facility] [address] [place], [place] [address] [phone] 06/21/2020 8:54 AM

## 2020-06-21 NOTE — Progress Notes (Signed)
Primary Care Physician: Glori Bickers, MD Primary Cardiologist: Dr Earna Coder Primary Electrophysiologist: Dr Johney Frame Referring Physician: Dr Johney Frame   Thomas Simon is a 75 y.o. male with a history of CAD s/p CABG, diabetes, HTN, hypothyroidism, and atrial fibrillation who presents for follow up in the [facility]. The patient was initially diagnosed with atrial fibrillation 02/2020 after presenting with symptoms of fatigue and SOB. Patient is on Eliquis for a CHADS2VASC score of 5. He underwent DCCV on 04/30/20. Unfortunately, he was back in afib before even leaving the hospital. He does note that he has more fatigue and SOB while in afib.  On follow up today, patient is s/p afib ablation 06/15/20. Unfortunately, he is back in afib about two days post ablation with symptoms of fatigue and lightheadedness. He does report that he felt much better (more energy) the day after ablation. He denies CP, swallowing pain, or groin issues. There does not appear to be any specific triggers.   Today, he denies symptoms of palpitations, chest pain, orthopnea, PND, lower extremity edema, presyncope, syncope, snoring, daytime somnolence, bleeding, or neurologic sequela. The patient is tolerating medications without difficulties and is otherwise without complaint today.    Atrial Fibrillation Risk Factors:  he does not have symptoms or diagnosis of sleep apnea. he does not have a history of rheumatic fever. he does not have a history of alcohol use.   he has a BMI of Body mass index is 24.45 kg/m.Marland Kitchen Filed Weights   06/21/20 0848  Weight: 72.9 kg    Family History  Problem Relation Age of Onset  . Heart failure Mother   . Lung disease Father      Atrial Fibrillation Management history:  Previous antiarrhythmic drugs: none Previous cardioversions: 04/30/20 Previous ablations: 06/15/20 CHADS2VASC score: 5 Anticoagulation history: Eliquis   Past Medical History:   Diagnosis Date  . Coronary arteriosclerosis   . Diabetes mellitus without complication (HCC)   . Hyperlipidemia   . Hypertension   . Persistent atrial fibrillation (HCC)   . PVD (peripheral vascular disease) (HCC)   . Thyroid disease    Past Surgical History:  Procedure Laterality Date  . ATRIAL FIBRILLATION ABLATION N/A 06/15/2020   Procedure: ATRIAL FIBRILLATION ABLATION;  Surgeon: Hillis Range, MD;  Location: [facility] INVASIVE CV LAB;  Service: Cardiovascular;  Laterality: N/A;  . CARDIOVERSION N/A 04/30/2020   Procedure: CARDIOVERSION;  Surgeon: Chilton Si, MD;  Location: [facility] ENDOSCOPY;  Service: Cardiovascular;  Laterality: N/A;  . CHOLECYSTECTOMY    . KNEE ARTHROSCOPY W/ PARTIAL MEDIAL MENISCECTOMY    . open heart surgery    . ROTATOR CUFF REPAIR      Current Outpatient Medications  Medication Sig Dispense Refill  . apixaban (ELIQUIS) 5 MG TABS tablet Take 5 mg by mouth 2 (two) times daily.    . Calcium-Magnesium-Zinc (CAL-MAG-ZINC PO) Take 1 tablet by mouth 3 (three) times daily.    . Cholecalciferol (VITAMIN D3) 50 MCG (2000 UT) TABS Take 2,000 Units by mouth daily.    Marland Kitchen doxazosin (CARDURA) 4 MG tablet Take 4 mg by mouth at bedtime.    Marland Kitchen guaiFENesin (MUCINEX) 600 MG 12 hr tablet Take 1,200 mg by mouth 2 (two) times daily as needed for to loosen phlegm.    Boris Lown Oil Omega-3 500 MG CAPS Take 500 mg by mouth daily.    Marland Kitchen levothyroxine (SYNTHROID) 25 MCG tablet Take 25 mcg by mouth daily before breakfast.    . metFORMIN (GLUCOPHAGE) 500 MG  tablet Take 500 mg by mouth 2 (two) times daily with a meal.    . metoprolol succinate (TOPROL-XL) 50 MG 24 hr tablet Take 50 mg by mouth daily. Take with or immediately following a meal.    . Multiple Vitamins-Minerals (MULTIVITAMIN WITH MINERALS) tablet Take 1 tablet by mouth daily.    . pantoprazole (PROTONIX) 40 MG tablet Take 40 mg by mouth daily.    . simvastatin (ZOCOR) 20 MG tablet Take 20 mg by mouth at bedtime.    . vitamin B-12  (CYANOCOBALAMIN) 1000 MCG tablet Take 1,000 mcg by mouth daily.     No current facility-administered medications for this encounter.    Allergies  Allergen Reactions  . Celecoxib Shortness Of Breath    unknown  . Fentanyl     rash  . Hydrocodone Other (See Comments)    urinary retention  . Tramadol Other (See Comments)    urinary retention    Social History   Socioeconomic History  . Marital status: Married    Spouse name: Not on file  . Number of children: Not on file  . Years of education: Not on file  . Highest education level: Not on file  Occupational History  . Not on file  Tobacco Use  . Smoking status: Former Smoker    Quit date: 07/16/2003    Years since quitting: 16.9  . Smokeless tobacco: Current User    Types: Chew  Vaping Use  . Vaping Use: Unknown  Substance and Sexual Activity  . Alcohol use: Never  . Drug use: Never  . Sexual activity: Not on file  Other Topics Concern  . Not on file  Social History Narrative  . Not on file   Social Determinants of Health   Financial Resource Strain: Not on file  Food Insecurity: Not on file  Transportation Needs: Not on file  Physical Activity: Not on file  Stress: Not on file  Social Connections: Not on file  Intimate Partner Violence: Not on file     ROS- All systems are reviewed and negative except as per the HPI above.  Physical Exam: Vitals:   06/21/20 0848  BP: 124/82  Pulse: (!) 107  Weight: 72.9 kg  Height: 5\' 8"  (1.727 m)    GEN- The patient is a well appearing elderly male, alert and oriented x 3 today.   HEENT-head normocephalic, atraumatic, sclera clear, conjunctiva pink, hearing intact, trachea midline. Lungs- Clear to ausculation bilaterally, normal work of breathing Heart- irregular rate and rhythm, no murmurs, rubs or gallops  GI- soft, NT, ND, + BS Extremities- no clubbing, cyanosis, or edema MS- no significant deformity or atrophy Skin- no rash or lesion Psych- euthymic mood,  full affect Neuro- strength and sensation are intact   Wt Readings from Last 3 Encounters:  06/21/20 72.9 kg  06/15/20 74.8 kg  06/07/20 72.8 kg    EKG today demonstrates  Afib, PVC Vent. rate 107 BPM PR interval * ms QRS duration 88 ms QT/QTc 334/445 ms  Epic records are reviewed at length today  CHA2DS2-VASc Score = 5  The patient's score is based upon: CHF History: No HTN History: Yes Diabetes History: Yes Stroke History: No Vascular Disease History: Yes Age Score: 2 Gender Score: 0      ASSESSMENT AND PLAN: 1. Persistent Atrial Fibrillation (ICD10:  I48.19) The patient's CHA2DS2-VASc score is 5, indicating a 7.2% annual risk of stroke.   S/p afib ablation 06/15/20 Patient back in afib today. Reassured patient  that breakthrough episodes of afib are common for the first 3 months post ablation. Will arrange for DCCV. Check bmet/CBC Continue Eliquis 5 mg BID Continue Toprol 50 mg daily  2. Secondary Hypercoagulable State (ICD10:  D68.69) The patient is at significant risk for stroke/thromboembolism based upon his CHA2DS2-VASc Score of 5.  Continue Apixaban (Eliquis).   3. CAD S/p CABG No anginal symptoms.  4. HTN Stable, no changes today.   Follow up in the AF clinic as scheduled post DCCV.   Ricky Braedin Millhouse PA-C Af[facility]  Hospital [address] [place], [place] [address] [phone] 06/21/2020 8:54 AM 

## 2020-06-21 NOTE — Patient Instructions (Signed)
Cardioversion scheduled for Wednesday, March 23rd  - Arrive at the [facility] and go to admitting at 630AM  - Do not eat or drink anything after midnight the night prior to your procedure.  - Take all your morning medication (except diabetic medications) with a sip of water prior to arrival.  - You will not be able to drive home after your procedure.  - Do NOT miss any doses of your blood thinner - if you should miss a dose please notify our office immediately.  - If you feel as if you go back into normal rhythm prior to scheduled cardioversion, please notify our office immediately. If your procedure is canceled in the cardioversion suite you will be charged a cancellation fee.

## 2020-06-22 LAB — SARS CORONAVIRUS 2 (TAT 6-24 HRS): SARS Coronavirus 2: NEGATIVE

## 2020-06-22 NOTE — Telephone Encounter (Signed)
error 

## 2020-06-22 NOTE — Anesthesia Preprocedure Evaluation (Addendum)
Anesthesia Evaluation  Patient identified by MRN, date of birth, ID band Patient awake    Reviewed: Allergy & Precautions, NPO status , Patient's Chart, lab work & pertinent test results  Airway Mallampati: I       Dental  (+) Partial Upper, Edentulous Lower, Poor Dentition   Pulmonary neg pulmonary ROS, former smoker,    Pulmonary exam normal        Cardiovascular hypertension, Pt. on home beta blockers and Pt. on medications + CAD and + Peripheral Vascular Disease  + dysrhythmias Atrial Fibrillation  Rhythm:Irregular Rate:Normal     Neuro/Psych negative neurological ROS  negative psych ROS   GI/Hepatic Neg liver ROS, GERD  Medicated and Controlled,  Endo/Other  diabetes, Type 2, Oral Hypoglycemic AgentsHypothyroidism   Renal/GU negative Renal ROS  negative genitourinary   Musculoskeletal negative musculoskeletal ROS (+)   Abdominal Normal abdominal exam  (+)   Peds  Hematology  (+) anemia ,   Anesthesia Other Findings   Reproductive/Obstetrics                            Anesthesia Physical  Anesthesia Plan  ASA: III  Anesthesia Plan: General   Post-op Pain Management:    Induction: Intravenous  PONV Risk Score and Plan: 1 and Treatment may vary due to age or medical condition and Propofol infusion  Airway Management Planned: Natural Airway and Simple Face Mask  Additional Equipment: None  Intra-op Plan:   Post-operative Plan:   Informed Consent: I have reviewed the patients History and Physical, chart, labs and discussed the procedure including the risks, benefits and alternatives for the proposed anesthesia with the patient or authorized representative who has indicated his/her understanding and acceptance.     Dental advisory given  Plan Discussed with: CRNA  Anesthesia Plan Comments:        Anesthesia Quick Evaluation

## 2020-06-23 ENCOUNTER — Ambulatory Visit (HOSPITAL_COMMUNITY): Payer: Medicare Other | Admitting: Anesthesiology

## 2020-06-23 ENCOUNTER — Ambulatory Visit (HOSPITAL_COMMUNITY)
Admission: RE | Admit: 2020-06-23 | Discharge: 2020-06-23 | Disposition: A | Discharge status: Home / Self Care | Payer: Medicare Other | Attending: Cardiovascular Disease | Admitting: Cardiovascular Disease

## 2020-06-23 ENCOUNTER — Encounter (HOSPITAL_COMMUNITY)
Admission: RE | Disposition: A | Discharge status: Home / Self Care | Payer: Self-pay | Source: Home / Self Care | Attending: Cardiovascular Disease

## 2020-06-23 ENCOUNTER — Encounter (HOSPITAL_COMMUNITY): Payer: Self-pay | Admitting: Cardiovascular Disease

## 2020-06-23 DIAGNOSIS — I4819 Other persistent atrial fibrillation: Secondary | ICD-10-CM | POA: Diagnosis present

## 2020-06-23 DIAGNOSIS — I2510 Atherosclerotic heart disease of native coronary artery without angina pectoris: Secondary | ICD-10-CM | POA: Insufficient documentation

## 2020-06-23 DIAGNOSIS — Z885 Allergy status to narcotic agent status: Secondary | ICD-10-CM | POA: Insufficient documentation

## 2020-06-23 DIAGNOSIS — Z951 Presence of aortocoronary bypass graft: Secondary | ICD-10-CM | POA: Diagnosis not present

## 2020-06-23 DIAGNOSIS — Z7901 Long term (current) use of anticoagulants: Secondary | ICD-10-CM | POA: Insufficient documentation

## 2020-06-23 DIAGNOSIS — Z79899 Other long term (current) drug therapy: Secondary | ICD-10-CM | POA: Diagnosis not present

## 2020-06-23 DIAGNOSIS — Z888 Allergy status to other drugs, medicaments and biological substances status: Secondary | ICD-10-CM | POA: Insufficient documentation

## 2020-06-23 DIAGNOSIS — D6869 Other thrombophilia: Secondary | ICD-10-CM | POA: Insufficient documentation

## 2020-06-23 DIAGNOSIS — Z20822 Contact with and (suspected) exposure to covid-19: Secondary | ICD-10-CM | POA: Insufficient documentation

## 2020-06-23 DIAGNOSIS — I10 Essential (primary) hypertension: Secondary | ICD-10-CM | POA: Diagnosis not present

## 2020-06-23 DIAGNOSIS — Z87891 Personal history of nicotine dependence: Secondary | ICD-10-CM | POA: Diagnosis not present

## 2020-06-23 HISTORY — PX: CARDIOVERSION: SHX1299

## 2020-06-23 SURGERY — CARDIOVERSION
Anesthesia: General

## 2020-06-23 MED ORDER — PROPOFOL 10 MG/ML IV BOLUS
INTRAVENOUS | Status: DC | PRN
  Administered 2020-06-23: 60 mg via INTRAVENOUS

## 2020-06-23 MED ORDER — SODIUM CHLORIDE 0.9 % IV SOLN: INTRAVENOUS | Status: DC | PRN

## 2020-06-23 MED ORDER — LIDOCAINE 2% (20 MG/ML) 5 ML SYRINGE
INTRAMUSCULAR | Status: DC | PRN
  Administered 2020-06-23: 60 mg via INTRAVENOUS

## 2020-06-23 NOTE — CV Procedure (Signed)
    Cardioversion Note  Thomas Simon [id number] 1945/08/07  Procedure: DC Cardioversion Indications: Atrial fib   Procedure Details Consent: Obtained Time Out: Verified patient identification, verified procedure, site/side was marked, verified correct patient position, special equipment/implants available, Radiology Safety Procedures followed,  medications/allergies/relevent history reviewed, required imaging and test results available.  Performed  The patient has been on adequate anticoagulation.  The patient received IV  Lidocaine 60 mg followed by Propofol 60 mg IV  for sedation.  Synchronous cardioversion was performed at  200  joules.  The cardioversion was successful.     Complications: No apparent complications Patient did tolerate procedure well.   Thomas Simon, Thomas Hageman., MD, [facility] 06/23/2020, 8:01 AM

## 2020-06-23 NOTE — Anesthesia Postprocedure Evaluation (Signed)
Anesthesia Post Note  Patient: Thomas Simon  Procedure(s) Performed: CARDIOVERSION (N/A )     Patient location during evaluation: Endoscopy Anesthesia Type: General Level of consciousness: awake and sedated Pain management: pain level controlled Vital Signs Assessment: post-procedure vital signs reviewed and stable Respiratory status: spontaneous breathing Cardiovascular status: stable Postop Assessment: no apparent nausea or vomiting Anesthetic complications: no   No complications documented.  Last Vitals:  Vitals:   06/23/20 0700  BP: 127/75  Pulse: 99  Resp: 19  Temp: 36.6 C    Last Pain:  Vitals:   06/23/20 0700  TempSrc: Temporal  PainSc: 0-No pain                 Caren Macadam

## 2020-06-23 NOTE — Discharge Instructions (Signed)
Electrical Cardioversion Electrical cardioversion is the delivery of a jolt of electricity to restore a normal rhythm to the heart. A rhythm that is too fast or is not regular keeps the heart from pumping well. In this procedure, sticky patches or metal paddles are placed on the chest to deliver electricity to the heart from a device. This procedure may be done in an emergency if:  There is low or no blood pressure as a result of the heart rhythm.  Normal rhythm must be restored as fast as possible to protect the brain and heart from further damage.  It may save a life. This may also be a scheduled procedure for irregular or fast heart rhythms that are not immediately life-threatening. Tell a health care provider about:  Any allergies you have.  All medicines you are taking, including vitamins, herbs, eye drops, creams, and over-the-counter medicines.  Any problems you or family members have had with anesthetic medicines.  Any blood disorders you have.  Any surgeries you have had.  Any medical conditions you have.  Whether you are pregnant or may be pregnant. What are the risks? Generally, this is a safe procedure. However, problems may occur, including:  Allergic reactions to medicines.  A blood clot that breaks free and travels to other parts of your body.  The possible return of an abnormal heart rhythm within hours or days after the procedure.  Your heart stopping (cardiac arrest). This is rare. What happens before the procedure? Medicines  Your health care provider may have you start taking: ? Blood-thinning medicines (anticoagulants) so your blood does not clot as easily. ? Medicines to help stabilize your heart rate and rhythm.  Ask your health care provider about: ? Changing or stopping your regular medicines. This is especially important if you are taking diabetes medicines or blood thinners. ? Taking medicines such as aspirin and ibuprofen. These medicines can  thin your blood. Do not take these medicines unless your health care provider tells you to take them. ? Taking over-the-counter medicines, vitamins, herbs, and supplements. General instructions  Follow instructions from your health care provider about eating or drinking restrictions.  Plan to have someone take you home from the hospital or clinic.  If you will be going home right after the procedure, plan to have someone with you for 24 hours.  Ask your health care provider what steps will be taken to help prevent infection. These may include washing your skin with a germ-killing soap. What happens during the procedure?  An IV will be inserted into one of your veins.  Sticky patches (electrodes) or metal paddles may be placed on your chest.  You will be given a medicine to help you relax (sedative).  An electrical shock will be delivered. The procedure may vary among health care providers and hospitals.   What can I expect after the procedure?  Your blood pressure, heart rate, breathing rate, and blood oxygen level will be monitored until you leave the hospital or clinic.  Your heart rhythm will be watched to make sure it does not change.  You may have some redness on the skin where the shocks were given. Follow these instructions at home:  Do not drive for 24 hours if you were given a sedative during your procedure.  Take over-the-counter and prescription medicines only as told by your health care provider.  Ask your health care provider how to check your pulse. Check it often.  Rest for 48 hours after the procedure   or as told by your health care provider.  Avoid or limit your caffeine use as told by your health care provider.  Keep all follow-up visits as told by your health care provider. This is important. Contact a health care provider if:  You feel like your heart is beating too quickly or your pulse is not regular.  You have a serious muscle cramp that does not go  away. Get help right away if:  You have discomfort in your chest.  You are dizzy or you feel faint.  You have trouble breathing or you are short of breath.  Your speech is slurred.  You have trouble moving an arm or leg on one side of your body.  Your fingers or toes turn cold or blue. Summary  Electrical cardioversion is the delivery of a jolt of electricity to restore a normal rhythm to the heart.  This procedure may be done right away in an emergency or may be a scheduled procedure if the condition is not an emergency.  Generally, this is a safe procedure.  After the procedure, check your pulse often as told by your health care provider. This information is not intended to replace advice given to you by your health care provider. Make sure you discuss any questions you have with your health care provider. Document Revised: 10/21/2018 Document Reviewed: 10/21/2018 Elsevier Patient Education  2021 [employer]  

## 2020-06-23 NOTE — Anesthesia Procedure Notes (Signed)
Procedure Name: General with mask airway Date/Time: 06/23/2020 7:58 AM Performed by: Elliot Dally, CRNA Pre-anesthesia Checklist: Patient identified, Emergency Drugs available, Suction available, Patient being monitored and Timeout performed Patient Re-evaluated:Patient Re-evaluated prior to induction Oxygen Delivery Method: Ambu bag Preoxygenation: Pre-oxygenation with 100% oxygen

## 2020-06-23 NOTE — Interval H&P Note (Signed)
History and Physical Interval Note:  06/23/2020 7:49 AM  Thomas Simon  has presented today for surgery, with the diagnosis of AFIB.  The various methods of treatment have been discussed with the patient and family. After consideration of risks, benefits and other options for treatment, the patient has consented to  Procedure(s): CARDIOVERSION (N/A) as a surgical intervention.  The patient's history has been reviewed, patient examined, no change in status, stable for surgery.  I have reviewed the patient's chart and labs.  Questions were answered to the patient's satisfaction.     Kristeen Miss

## 2020-06-23 NOTE — Transfer of Care (Signed)
Immediate Anesthesia Transfer of Care Note  Patient: Thomas Simon  Procedure(s) Performed: CARDIOVERSION (N/A )  Patient Location: Endoscopy Unit  Anesthesia Type:General  Level of Consciousness: drowsy  Airway & Oxygen Therapy: Patient Spontanous Breathing  Post-op Assessment: Report given to RN and Post -op Vital signs reviewed and stable  Post vital signs: Reviewed and stable  Last Vitals:  Vitals Value Taken Time  BP    Temp    Pulse    Resp    SpO2      Last Pain:  Vitals:   06/23/20 0700  TempSrc: Temporal  PainSc: 0-No pain         Complications: No complications documented.

## 2020-06-25 ENCOUNTER — Telehealth (HOSPITAL_COMMUNITY): Payer: Self-pay | Admitting: *Deleted

## 2020-06-25 ENCOUNTER — Encounter (HOSPITAL_COMMUNITY): Payer: Self-pay | Admitting: Cardiovascular Disease

## 2020-06-25 NOTE — Telephone Encounter (Signed)
Patient son called in stating patient is back in AF. HR mostly in the 90s just feels lightheaded. Will plan to bring pt into office next week for next steps. Pt and son in agreement. No change in medication since mainly rate controlled.

## 2020-06-28 NOTE — Progress Notes (Signed)
Primary Care Physician: Glori Bickers, MD Primary Cardiologist: Dr Earna Coder Primary Electrophysiologist: Dr Johney Frame Referring Physician: Dr Johney Frame   Thomas Simon is a 75 y.o. male with a history of CAD s/p CABG, diabetes, HTN, hypothyroidism, and atrial fibrillation who presents for follow up in the [facility]. The patient was initially diagnosed with atrial fibrillation 02/2020 after presenting with symptoms of fatigue and SOB. Patient is on Eliquis for a CHADS2VASC score of 5. He underwent DCCV on 04/30/20. Unfortunately, he was back in afib before even leaving the hospital. He does note that he has more fatigue and SOB while in afib. Patient is s/p afib ablation 06/15/20. Unfortunately, he was back in afib about two days post ablation with symptoms of fatigue and lightheadedness.  On follow up today, patient is s/p DCCV on 06/23/20. Unfortunately, he only stayed in SR for about 2 days. During that time he felt "great" with much more energy. There were no specific triggers that he could identify. He denies any bleeding issues on anticoagulation.   Today, he denies symptoms of palpitations, chest pain, orthopnea, PND, lower extremity edema, presyncope, syncope, snoring, daytime somnolence, bleeding, or neurologic sequela. The patient is tolerating medications without difficulties and is otherwise without complaint today.    Atrial Fibrillation Risk Factors:  he does not have symptoms or diagnosis of sleep apnea. he does not have a history of rheumatic fever. he does not have a history of alcohol use.   he has a BMI of Body mass index is 24.48 kg/m.Marland Kitchen Filed Weights   06/29/20 0836  Weight: 73 kg    Family History  Problem Relation Age of Onset  . Heart failure Mother   . Lung disease Father      Atrial Fibrillation Management history:  Previous antiarrhythmic drugs: none Previous cardioversions: 04/30/20, 06/23/20 Previous ablations:  06/15/20 CHADS2VASC score: 5 Anticoagulation history: Eliquis   Past Medical History:  Diagnosis Date  . Coronary arteriosclerosis   . Diabetes mellitus without complication (HCC)   . Hyperlipidemia   . Hypertension   . Persistent atrial fibrillation (HCC)   . PVD (peripheral vascular disease) (HCC)   . Thyroid disease    Past Surgical History:  Procedure Laterality Date  . ATRIAL FIBRILLATION ABLATION N/A 06/15/2020   Procedure: ATRIAL FIBRILLATION ABLATION;  Surgeon: Hillis Range, MD;  [facility]: [facility]VASIVE CV LAB;  Service: Cardiovascular;  Laterality: N/A;  . CARDIOVERSION N/A 04/30/2020   Procedure: CARDIOVERSION;  Surgeon: Chilton Si, MD;  Location: [facility] ENDOSCOPY;  Service: Cardiovascular;  Laterality: N/A;  . CARDIOVERSION N/A 06/23/2020   Procedure: CARDIOVERSION;  Surgeon: Vesta Mixer, MD;  Location: [facility] ENDOSCOPY;  Service: Cardiovascular;  Laterality: N/A;  . CHOLECYSTECTOMY    . KNEE ARTHROSCOPY W/ PARTIAL MEDIAL MENISCECTOMY    . open heart surgery    . ROTATOR CUFF REPAIR      Current Outpatient Medications  Medication Sig Dispense Refill  . amiodarone (PACERONE) 200 MG tablet Take 1 tablet by mouth twice a day for 1 month then reduce to 1 tablet daily 60 tablet 0  . apixaban (ELIQUIS) 5 MG TABS tablet Take 5 mg by mouth 2 (two) times daily.    . Calcium-Magnesium-Zinc (CAL-MAG-ZINC PO) Take 1 tablet by mouth 3 (three) times daily.    . Cholecalciferol (VITAMIN D3) 50 MCG (2000 UT) TABS Take 2,000 Units by mouth daily.    Marland Kitchen doxazosin (CARDURA) 4 MG tablet Take 4 mg by mouth at bedtime.    Marland Kitchen  guaiFENesin (MUCINEX) 600 MG 12 hr tablet Take 1,200 mg by mouth 2 (two) times daily as needed for to loosen phlegm.    Boris Lown Oil Omega-3 500 MG CAPS Take 500 mg by mouth daily.    Marland Kitchen levothyroxine (SYNTHROID) 25 MCG tablet Take 25 mcg by mouth daily before breakfast.    . metFORMIN (GLUCOPHAGE) 500 MG tablet Take 500 mg by mouth 2 (two) times daily with a meal.    .  metoprolol succinate (TOPROL-XL) 50 MG 24 hr tablet Take 50 mg by mouth daily. Take with or immediately following a meal.    . Multiple Vitamins-Minerals (MULTIVITAMIN WITH MINERALS) tablet Take 1 tablet by mouth daily.    . pantoprazole (PROTONIX) 40 MG tablet Take 40 mg by mouth daily.    . simvastatin (ZOCOR) 20 MG tablet Take 20 mg by mouth at bedtime.    . vitamin B-12 (CYANOCOBALAMIN) 1000 MCG tablet Take 1,000 mcg by mouth daily.     No current facility-administered medications for this encounter.    Allergies  Allergen Reactions  . Celecoxib Shortness Of Breath    unknown  . Fentanyl     rash  . Hydrocodone Other (See Comments)    urinary retention  . Tramadol Other (See Comments)    urinary retention    Social History   Socioeconomic History  . Marital status: Married    Spouse name: Not on file  . Number of children: Not on file  . Years of education: Not on file  . Highest education level: Not on file  Occupational History  . Not on file  Tobacco Use  . Smoking status: Former Smoker    Quit date: 07/16/2003    Years since quitting: 16.9  . Smokeless tobacco: Current User    Types: Chew  Vaping Use  . Vaping Use: Unknown  Substance and Sexual Activity  . Alcohol use: Never  . Drug use: Never  . Sexual activity: Not on file  Other Topics Concern  . Not on file  Social History Narrative  . Not on file   Social Determinants of Health   Financial Resource Strain: Not on file  Food Insecurity: Not on file  Transportation Needs: Not on file  Physical Activity: Not on file  Stress: Not on file  Social Connections: Not on file  Intimate Partner Violence: Not on file     ROS- All systems are reviewed and negative except as per the HPI above.  Physical Exam: Vitals:   06/29/20 0836  BP: 120/72  Pulse: 89  Weight: 73 kg  Height: 5\' 8"  (1.727 m)    GEN- The patient is a well appearing elderly male, alert and oriented x 3 today.   HEENT-head  normocephalic, atraumatic, sclera clear, conjunctiva pink, hearing intact, trachea midline. Lungs- Clear to ausculation bilaterally, normal work of breathing Heart- irregular rate and rhythm, no murmurs, rubs or gallops  GI- soft, NT, ND, + BS Extremities- no clubbing, cyanosis, or edema MS- no significant deformity or atrophy Skin- no rash or lesion Psych- euthymic mood, full affect Neuro- strength and sensation are intact   Wt Readings from Last 3 Encounters:  06/29/20 73 kg  06/23/20 72.6 kg  06/21/20 72.9 kg    EKG today demonstrates  Afib Vent. rate 89 BPM PR interval * ms QRS duration 92 ms QT/QTcB 356/433 ms  Epic records are reviewed at length today  CHA2DS2-VASc Score = 5  The patient's score is based upon: CHF History:  No HTN History: Yes Diabetes History: Yes Stroke History: No Vascular Disease History: Yes Age Score: 2 Gender Score: 0      ASSESSMENT AND PLAN: 1. Persistent Atrial Fibrillation (ICD10:  I48.19) The patient's CHA2DS2-VASc score is 5, indicating a 7.2% annual risk of stroke.   S/p afib ablation 06/15/20 with DCCV on 06/23/20. He is back in symptomatic rate controlled afib.  We discussed therapeutic options today including AAD therapy and repeat DCCV. After discussing the risks and benefits, will start amiodarone 200 mg BID. Anticipate this will be short term.  No adjustment needed for simvastatin as he is already on 20 mg daily. Continue Eliquis 5 mg BID Continue Toprol 50 mg daily  2. Secondary Hypercoagulable State (ICD10:  D68.69) The patient is at significant risk for stroke/thromboembolism based upon his CHA2DS2-VASc Score of 5.  Continue Apixaban (Eliquis).   3. CAD S/p CABG No anginal symptoms.  4. HTN Stable, no changes today.   Follow up in the AF clinic as scheduled.    [name] PA-C Afib Clinic [facility] [address] [place], [place] [address] [phone] 06/29/2020 9:26 AM

## 2020-06-29 ENCOUNTER — Encounter (HOSPITAL_COMMUNITY): Payer: Self-pay | Admitting: Physician Assistant

## 2020-06-29 ENCOUNTER — Other Ambulatory Visit: Payer: Self-pay

## 2020-06-29 ENCOUNTER — Ambulatory Visit (HOSPITAL_COMMUNITY)
Admission: RE | Admit: 2020-06-29 | Discharge: 2020-06-29 | Disposition: A | Discharge status: Home / Self Care | Payer: Medicare Other | Source: Ambulatory Visit | Attending: Physician Assistant | Admitting: Physician Assistant

## 2020-06-29 VITALS — BP 120/72 | HR 89 | Ht 68.0 in | Wt 161.0 lb

## 2020-06-29 DIAGNOSIS — Z79899 Other long term (current) drug therapy: Secondary | ICD-10-CM | POA: Diagnosis not present

## 2020-06-29 DIAGNOSIS — Z7901 Long term (current) use of anticoagulants: Secondary | ICD-10-CM | POA: Insufficient documentation

## 2020-06-29 DIAGNOSIS — Z886 Allergy status to analgesic agent status: Secondary | ICD-10-CM | POA: Insufficient documentation

## 2020-06-29 DIAGNOSIS — Z87891 Personal history of nicotine dependence: Secondary | ICD-10-CM | POA: Insufficient documentation

## 2020-06-29 DIAGNOSIS — I10 Essential (primary) hypertension: Secondary | ICD-10-CM | POA: Insufficient documentation

## 2020-06-29 DIAGNOSIS — Z8249 Family history of ischemic heart disease and other diseases of the circulatory system: Secondary | ICD-10-CM | POA: Diagnosis not present

## 2020-06-29 DIAGNOSIS — Z95810 Presence of automatic (implantable) cardiac defibrillator: Secondary | ICD-10-CM | POA: Insufficient documentation

## 2020-06-29 DIAGNOSIS — D6869 Other thrombophilia: Secondary | ICD-10-CM | POA: Diagnosis not present

## 2020-06-29 DIAGNOSIS — I2510 Atherosclerotic heart disease of native coronary artery without angina pectoris: Secondary | ICD-10-CM | POA: Diagnosis not present

## 2020-06-29 DIAGNOSIS — Z885 Allergy status to narcotic agent status: Secondary | ICD-10-CM | POA: Insufficient documentation

## 2020-06-29 DIAGNOSIS — Z951 Presence of aortocoronary bypass graft: Secondary | ICD-10-CM | POA: Insufficient documentation

## 2020-06-29 DIAGNOSIS — I4819 Other persistent atrial fibrillation: Secondary | ICD-10-CM | POA: Diagnosis present

## 2020-06-29 MED ORDER — AMIODARONE HCL 200 MG PO TABS: ORAL_TABLET | ORAL | 0 refills | Status: DC

## 2020-06-29 NOTE — Patient Instructions (Signed)
Start Amiodarone 200mg  twice a day for 1 month then we will reduce to once a day WITH FOOD

## 2020-07-13 ENCOUNTER — Encounter (HOSPITAL_COMMUNITY): Payer: Self-pay | Admitting: Physician Assistant

## 2020-07-13 ENCOUNTER — Other Ambulatory Visit: Payer: Self-pay

## 2020-07-13 ENCOUNTER — Ambulatory Visit (HOSPITAL_COMMUNITY)
Admission: RE | Admit: 2020-07-13 | Discharge: 2020-07-13 | Disposition: A | Discharge status: Home / Self Care | Payer: Medicare Other | Source: Ambulatory Visit | Attending: Physician Assistant | Admitting: Physician Assistant

## 2020-07-13 VITALS — BP 118/72 | HR 77 | Ht 68.0 in | Wt 160.8 lb

## 2020-07-13 DIAGNOSIS — Z79899 Other long term (current) drug therapy: Secondary | ICD-10-CM | POA: Insufficient documentation

## 2020-07-13 DIAGNOSIS — I4819 Other persistent atrial fibrillation: Secondary | ICD-10-CM | POA: Diagnosis present

## 2020-07-13 DIAGNOSIS — Z87891 Personal history of nicotine dependence: Secondary | ICD-10-CM | POA: Insufficient documentation

## 2020-07-13 DIAGNOSIS — D6869 Other thrombophilia: Secondary | ICD-10-CM

## 2020-07-13 DIAGNOSIS — E039 Hypothyroidism, unspecified: Secondary | ICD-10-CM | POA: Insufficient documentation

## 2020-07-13 DIAGNOSIS — I4892 Unspecified atrial flutter: Secondary | ICD-10-CM | POA: Diagnosis not present

## 2020-07-13 DIAGNOSIS — E119 Type 2 diabetes mellitus without complications: Secondary | ICD-10-CM | POA: Insufficient documentation

## 2020-07-13 DIAGNOSIS — I10 Essential (primary) hypertension: Secondary | ICD-10-CM | POA: Insufficient documentation

## 2020-07-13 DIAGNOSIS — Z7984 Long term (current) use of oral hypoglycemic drugs: Secondary | ICD-10-CM | POA: Insufficient documentation

## 2020-07-13 DIAGNOSIS — I2510 Atherosclerotic heart disease of native coronary artery without angina pectoris: Secondary | ICD-10-CM | POA: Diagnosis not present

## 2020-07-13 DIAGNOSIS — Z951 Presence of aortocoronary bypass graft: Secondary | ICD-10-CM | POA: Insufficient documentation

## 2020-07-13 DIAGNOSIS — Z7901 Long term (current) use of anticoagulants: Secondary | ICD-10-CM | POA: Diagnosis not present

## 2020-07-13 NOTE — Patient Instructions (Signed)
Continue Amiodarone 200mg  twice a day for 2 more weeks then reduce to 200mg  once a day

## 2020-07-13 NOTE — Progress Notes (Signed)
Primary Care Physician: Glori Bickers, MD Primary Cardiologist: Dr Earna Coder Primary Electrophysiologist: Dr Johney Frame Referring Physician: Dr Johney Frame   Thomas Simon is a 75 y.o. male with a history of CAD s/p CABG, diabetes, HTN, hypothyroidism, and atrial fibrillation who presents for follow up in the [facility]. The patient was initially diagnosed with atrial fibrillation 02/2020 after presenting with symptoms of fatigue and SOB. Patient is on Eliquis for a CHADS2VASC score of 5. He underwent DCCV on 04/30/20. Unfortunately, he was back in afib before even leaving the hospital. He does note that he has more fatigue and SOB while in afib. Patient is s/p afib ablation 06/15/20. Unfortunately, he was back in afib about two days post ablation with symptoms of fatigue and lightheadedness. Patient is s/p DCCV on 06/23/20. Unfortunately, he only stayed in SR for about 2 days. He was started on amiodarone.  On follow up today, patient reports that he was in SR yesterday with symptomatic improvement and confirmed by his smart watch. He is back in rate controlled atrial flutter today. He is tolerating the amiodarone without issue.  Today, he denies symptoms of palpitations, chest pain, orthopnea, PND, lower extremity edema, presyncope, syncope, snoring, daytime somnolence, bleeding, or neurologic sequela. The patient is tolerating medications without difficulties and is otherwise without complaint today.    Atrial Fibrillation Risk Factors:  he does not have symptoms or diagnosis of sleep apnea. he does not have a history of rheumatic fever. he does not have a history of alcohol use.   he has a BMI of Body mass index is 24.45 kg/m.Marland Kitchen Filed Weights   07/13/20 1352  Weight: 72.9 kg    Family History  Problem Relation Age of Onset  . Heart failure Mother   . Lung disease Father      Atrial Fibrillation Management history:  Previous antiarrhythmic drugs: amiodarone   Previous cardioversions: 04/30/20, 06/23/20 Previous ablations: 06/15/20 CHADS2VASC score: 5 Anticoagulation history: Eliquis   Past Medical History:  Diagnosis Date  . Coronary arteriosclerosis   . Diabetes mellitus without complication (HCC)   . Hyperlipidemia   . Hypertension   . Persistent atrial fibrillation (HCC)   . PVD (peripheral vascular disease) (HCC)   . Thyroid disease    Past Surgical History:  Procedure Laterality Date  . ATRIAL FIBRILLATION ABLATION N/A 06/15/2020   Procedure: ATRIAL FIBRILLATION ABLATION;  Surgeon: Hillis Range, MD;  Location: [facility] INVASIVE CV LAB;  Service: Cardiovascular;  Laterality: N/A;  . CARDIOVERSION N/A 04/30/2020   Procedure: CARDIOVERSION;  Surgeon: Chilton Si, MD;  Location: [facility] ENDOSCOPY;  Service: Cardiovascular;  Laterality: N/A;  . CARDIOVERSION N/A 06/23/2020   Procedure: CARDIOVERSION;  Surgeon: Vesta Mixer, MD;  Location: [facility] ENDOSCOPY;  Service: Cardiovascular;  Laterality: N/A;  . CHOLECYSTECTOMY    . KNEE ARTHROSCOPY W/ PARTIAL MEDIAL MENISCECTOMY    . open heart surgery    . ROTATOR CUFF REPAIR      Current Outpatient Medications  Medication Sig Dispense Refill  . amiodarone (PACERONE) 200 MG tablet Take 1 tablet by mouth twice a day for 1 month then reduce to 1 tablet daily 60 tablet 0  . apixaban (ELIQUIS) 5 MG TABS tablet Take 5 mg by mouth 2 (two) times daily.    . Calcium-Magnesium-Zinc (CAL-MAG-ZINC PO) Take 1 tablet by mouth 3 (three) times daily.    . Cholecalciferol (VITAMIN D3) 50 MCG (2000 UT) TABS Take 2,000 Units by mouth daily.    Marland Kitchen doxazosin (CARDURA) 4  MG tablet Take 4 mg by mouth at bedtime.    Marland Kitchen guaiFENesin (MUCINEX) 600 MG 12 hr tablet Take 1,200 mg by mouth 2 (two) times daily as needed for to loosen phlegm.    Boris Lown Oil Omega-3 500 MG CAPS Take 500 mg by mouth daily.    Marland Kitchen levothyroxine (SYNTHROID) 25 MCG tablet Take 25 mcg by mouth daily before breakfast.    . metFORMIN (GLUCOPHAGE) 500 MG  tablet Take 500 mg by mouth 2 (two) times daily with a meal.    . metoprolol succinate (TOPROL-XL) 50 MG 24 hr tablet Take 50 mg by mouth daily. Take with or immediately following a meal.    . Multiple Vitamins-Minerals (MULTIVITAMIN WITH MINERALS) tablet Take 1 tablet by mouth daily.    . pantoprazole (PROTONIX) 40 MG tablet Take 40 mg by mouth daily.    . simvastatin (ZOCOR) 20 MG tablet Take 20 mg by mouth at bedtime.    . vitamin B-12 (CYANOCOBALAMIN) 1000 MCG tablet Take 1,000 mcg by mouth daily.     No current facility-administered medications for this encounter.    Allergies  Allergen Reactions  . Celecoxib Shortness Of Breath    unknown  . Fentanyl     rash  . Hydrocodone Other (See Comments)    urinary retention  . Tramadol Other (See Comments)    urinary retention    Social History   Socioeconomic History  . Marital status: Married    Spouse name: Not on file  . Number of children: Not on file  . Years of education: Not on file  . Highest education level: Not on file  Occupational History  . Not on file  Tobacco Use  . Smoking status: Former Smoker    Quit date: 07/16/2003    Years since quitting: 17.0  . Smokeless tobacco: Current User    Types: Chew  Vaping Use  . Vaping Use: Unknown  Substance and Sexual Activity  . Alcohol use: Never  . Drug use: Never  . Sexual activity: Not on file  Other Topics Concern  . Not on file  Social History Narrative  . Not on file   Social Determinants of Health   Financial Resource Strain: Not on file  Food Insecurity: Not on file  Transportation Needs: Not on file  Physical Activity: Not on file  Stress: Not on file  Social Connections: Not on file  Intimate Partner Violence: Not on file     ROS- All systems are reviewed and negative except as per the HPI above.  Physical Exam: Vitals:   07/13/20 1352  BP: 118/72  Pulse: 77  Weight: 72.9 kg  Height: 5\' 8"  (1.727 m)    GEN- The patient is a well  appearing elderly male, alert and oriented x 3 today.   HEENT-head normocephalic, atraumatic, sclera clear, conjunctiva pink, hearing intact, trachea midline. Lungs- Clear to ausculation bilaterally, normal work of breathing Heart- irregular rate and rhythm, no murmurs, rubs or gallops  GI- soft, NT, ND, + BS Extremities- no clubbing, cyanosis, or edema MS- no significant deformity or atrophy Skin- no rash or lesion Psych- euthymic mood, full affect Neuro- strength and sensation are intact   Wt Readings from Last 3 Encounters:  07/13/20 72.9 kg  06/29/20 73 kg  06/23/20 72.6 kg    EKG today demonstrates  Atypical atrial flutter with variable block Vent. rate 77 BPM PR interval * ms QRS duration 96 ms QT/QTcB 402/454 ms  Epic records are  reviewed at length today  CHA2DS2-VASc Score = 5  The patient's score is based upon: CHF History: No HTN History: Yes Diabetes History: Yes Stroke History: No Vascular Disease History: Yes Age Score: 2 Gender Score: 0      ASSESSMENT AND PLAN: 1. Persistent Atrial Fibrillation/atrial flutter The patient's CHA2DS2-VASc score is 5, indicating a 7.2% annual risk of stroke.   S/p afib ablation 06/15/20 with DCCV on 06/23/20. Patient now appears to be going in and out of SR. Will hold on DCCV. Will continue to load on amiodarone and consider DCCV if he becomes persistent.  Continue amiodarone 200 mg BID for two weeks then decrease to once daily. Continue Eliquis 5 mg BID Continue Toprol 50 mg daily  2. Secondary Hypercoagulable State (ICD10:  D68.69) The patient is at significant risk for stroke/thromboembolism based upon his CHA2DS2-VASc Score of 5.  Continue Apixaban (Eliquis).   3. CAD S/p CABG No anginal symptoms.  4. HTN Stable, no changes today.   Follow up in the AF clinic in 2-3 weeks.    [name] PA-C Afib Clinic [facility] [address] [place], [place] [address] [phone] 07/13/2020 2:18 PM

## 2020-07-21 ENCOUNTER — Other Ambulatory Visit (HOSPITAL_COMMUNITY): Payer: Self-pay | Admitting: Physician Assistant

## 2020-08-02 ENCOUNTER — Ambulatory Visit (HOSPITAL_COMMUNITY)
Admission: RE | Admit: 2020-08-02 | Discharge: 2020-08-02 | Disposition: A | Discharge status: Home / Self Care | Payer: Medicare Other | Source: Ambulatory Visit | Attending: Physician Assistant | Admitting: Physician Assistant

## 2020-08-02 ENCOUNTER — Encounter (HOSPITAL_COMMUNITY): Payer: Self-pay | Admitting: Physician Assistant

## 2020-08-02 ENCOUNTER — Other Ambulatory Visit: Payer: Self-pay

## 2020-08-02 VITALS — BP 126/70 | HR 68 | Ht 68.0 in | Wt 161.0 lb

## 2020-08-02 DIAGNOSIS — E119 Type 2 diabetes mellitus without complications: Secondary | ICD-10-CM | POA: Insufficient documentation

## 2020-08-02 DIAGNOSIS — I4819 Other persistent atrial fibrillation: Secondary | ICD-10-CM | POA: Insufficient documentation

## 2020-08-02 DIAGNOSIS — Z951 Presence of aortocoronary bypass graft: Secondary | ICD-10-CM | POA: Insufficient documentation

## 2020-08-02 DIAGNOSIS — Z8249 Family history of ischemic heart disease and other diseases of the circulatory system: Secondary | ICD-10-CM | POA: Insufficient documentation

## 2020-08-02 DIAGNOSIS — Z79899 Other long term (current) drug therapy: Secondary | ICD-10-CM | POA: Diagnosis not present

## 2020-08-02 DIAGNOSIS — E039 Hypothyroidism, unspecified: Secondary | ICD-10-CM | POA: Diagnosis not present

## 2020-08-02 DIAGNOSIS — Z87891 Personal history of nicotine dependence: Secondary | ICD-10-CM | POA: Diagnosis not present

## 2020-08-02 DIAGNOSIS — Z888 Allergy status to other drugs, medicaments and biological substances status: Secondary | ICD-10-CM | POA: Insufficient documentation

## 2020-08-02 DIAGNOSIS — I10 Essential (primary) hypertension: Secondary | ICD-10-CM | POA: Diagnosis not present

## 2020-08-02 DIAGNOSIS — Z885 Allergy status to narcotic agent status: Secondary | ICD-10-CM | POA: Diagnosis not present

## 2020-08-02 DIAGNOSIS — I2510 Atherosclerotic heart disease of native coronary artery without angina pectoris: Secondary | ICD-10-CM | POA: Diagnosis not present

## 2020-08-02 DIAGNOSIS — D6869 Other thrombophilia: Secondary | ICD-10-CM | POA: Diagnosis not present

## 2020-08-02 DIAGNOSIS — Z79890 Hormone replacement therapy (postmenopausal): Secondary | ICD-10-CM | POA: Insufficient documentation

## 2020-08-02 DIAGNOSIS — Z7901 Long term (current) use of anticoagulants: Secondary | ICD-10-CM | POA: Diagnosis not present

## 2020-08-02 LAB — CBC
HCT: 33.8 % — ABNORMAL LOW (ref 39.0–52.0)
Hemoglobin: 10.4 g/dL — ABNORMAL LOW (ref 13.0–17.0)
MCH: 26 pg (ref 26.0–34.0)
MCHC: 30.8 g/dL (ref 30.0–36.0)
MCV: 84.5 fL (ref 80.0–100.0)
Platelets: 165 10*3/uL (ref 150–400)
RBC: 4 MIL/uL — ABNORMAL LOW (ref 4.22–5.81)
RDW: 19 % — ABNORMAL HIGH (ref 11.5–15.5)
WBC: 4.6 10*3/uL (ref 4.0–10.5)
nRBC: 0 % (ref 0.0–0.2)

## 2020-08-02 LAB — COMPREHENSIVE METABOLIC PANEL
ALT: 18 U/L (ref 0–44)
AST: 18 U/L (ref 15–41)
Albumin: 3.1 g/dL — ABNORMAL LOW (ref 3.5–5.0)
Alkaline Phosphatase: 84 U/L (ref 38–126)
Anion gap: 7 (ref 5–15)
BUN: 17 mg/dL (ref 8–23)
CO2: 26 mmol/L (ref 22–32)
Calcium: 8.7 mg/dL — ABNORMAL LOW (ref 8.9–10.3)
Chloride: 101 mmol/L (ref 98–111)
Creatinine, Ser: 0.95 mg/dL (ref 0.61–1.24)
GFR, Estimated: >60 mL/min (ref >60)
Glucose, Bld: 128 mg/dL — ABNORMAL HIGH (ref 70–99)
Potassium: 4.2 mmol/L (ref 3.5–5.1)
Sodium: 134 mmol/L — ABNORMAL LOW (ref 135–145)
Total Bilirubin: 1.1 mg/dL (ref 0.3–1.2)
Total Protein: 6 g/dL — ABNORMAL LOW (ref 6.5–8.1)

## 2020-08-02 LAB — TSH: TSH: 3.999 u[IU]/mL (ref 0.350–4.500)

## 2020-08-02 NOTE — Patient Instructions (Signed)
Cardioversion scheduled for Monday, May 16th  - Arrive at the [facility] and go to admitting at 730AM  - Do not eat or drink anything after midnight the night prior to your procedure.  - Take all your morning medication (except diabetic medications) with a sip of water prior to arrival.  - You will not be able to drive home after your procedure.  - Do NOT miss any doses of your blood thinner - if you should miss a dose please notify our office immediately.  - If you feel as if you go back into normal rhythm prior to scheduled cardioversion, please notify our office immediately. If your procedure is canceled in the cardioversion suite you will be charged a cancellation fee.

## 2020-08-02 NOTE — Addendum Note (Signed)
Encounter addended by: Shona Simpson, RN on: 08/02/2020 11:52 AM  Actions taken: Order list changed

## 2020-08-02 NOTE — Progress Notes (Signed)
Primary Care Physician: Glori Bickers, MD Primary Cardiologist: Dr Earna Coder Primary Electrophysiologist: Dr Johney Frame Referring Physician: Dr Johney Frame   Thomas Simon is a 75 y.o. male with a history of CAD s/p CABG, diabetes, HTN, hypothyroidism, and atrial fibrillation who presents for follow up in the [facility]. The patient was initially diagnosed with atrial fibrillation 02/2020 after presenting with symptoms of fatigue and SOB. Patient is on Eliquis for a CHADS2VASC score of 5. He underwent DCCV on 04/30/20. Unfortunately, he was back in afib before even leaving the hospital. He does note that he has more fatigue and SOB while in afib. Patient is s/p afib ablation 06/15/20. Unfortunately, he was back in afib about two days post ablation with symptoms of fatigue and lightheadedness. Patient is s/p DCCV on 06/23/20. He only stayed in SR for about 2 days. He was started on amiodarone.  On follow up today, patient reports he is about the same. He did have some SR after starting on amiodarone but has seen only afib the last 2 weeks. He denies any bleeding issues on amiodarone.   Today, he denies symptoms of palpitations, chest pain, orthopnea, PND, lower extremity edema, presyncope, syncope, snoring, daytime somnolence, bleeding, or neurologic sequela. The patient is tolerating medications without difficulties and is otherwise without complaint today.    Atrial Fibrillation Risk Factors:  he does not have symptoms or diagnosis of sleep apnea. he does not have a history of rheumatic fever. he does not have a history of alcohol use.   he has a BMI of Body mass index is 24.48 kg/m.Marland Kitchen Filed Weights   08/02/20 1031  Weight: 73 kg    Family History  Problem Relation Age of Onset  . Heart failure Mother   . Lung disease Father      Atrial Fibrillation Management history:  Previous antiarrhythmic drugs: amiodarone  Previous cardioversions: 04/30/20,  06/23/20 Previous ablations: 06/15/20 CHADS2VASC score: 5 Anticoagulation history: Eliquis   Past Medical History:  Diagnosis Date  . Coronary arteriosclerosis   . Diabetes mellitus without complication (HCC)   . Hyperlipidemia   . Hypertension   . Persistent atrial fibrillation (HCC)   . PVD (peripheral vascular disease) (HCC)   . Thyroid disease    Past Surgical History:  Procedure Laterality Date  . ATRIAL FIBRILLATION ABLATION N/A 06/15/2020   Procedure: ATRIAL FIBRILLATION ABLATION;  Surgeon: Hillis Range, MD;  Location: [facility] INVASIVE CV LAB;  Service: Cardiovascular;  Laterality: N/A;  . CARDIOVERSION N/A 04/30/2020   Procedure: CARDIOVERSION;  Surgeon: Chilton Si, MD;  Location: [facility] ENDOSCOPY;  Service: Cardiovascular;  Laterality: N/A;  . CARDIOVERSION N/A 06/23/2020   Procedure: CARDIOVERSION;  Surgeon: Vesta Mixer, MD;  Location: [facility] ENDOSCOPY;  Service: Cardiovascular;  Laterality: N/A;  . CHOLECYSTECTOMY    . KNEE ARTHROSCOPY W/ PARTIAL MEDIAL MENISCECTOMY    . open heart surgery    . ROTATOR CUFF REPAIR      Current Outpatient Medications  Medication Sig Dispense Refill  . amiodarone (PACERONE) 200 MG tablet Take 1 tablet (200 mg total) by mouth daily. 30 tablet 3  . apixaban (ELIQUIS) 5 MG TABS tablet Take 5 mg by mouth 2 (two) times daily.    . Cholecalciferol (VITAMIN D3) 50 MCG (2000 UT) TABS Take 2,000 Units by mouth daily.    Marland Kitchen doxazosin (CARDURA) 4 MG tablet Take 4 mg by mouth at bedtime.    . ferrous sulfate 325 (65 FE) MG tablet Take 325 mg by mouth  2 (two) times daily with a meal.    . gabapentin (NEURONTIN) 300 MG capsule Take 300 mg by mouth 2 (two) times daily.    Boris Lown Oil Omega-3 500 MG CAPS Take 500 mg by mouth daily.    Marland Kitchen levothyroxine (SYNTHROID) 25 MCG tablet Take 25 mcg by mouth daily before breakfast.    . magnesium oxide (MAG-OX) 400 MG tablet Take 400 mg by mouth daily.    . metFORMIN (GLUCOPHAGE) 500 MG tablet Take 500 mg by mouth 2  (two) times daily with a meal.    . metoprolol succinate (TOPROL-XL) 50 MG 24 hr tablet Take 50 mg by mouth daily. Take with or immediately following a meal.    . Multiple Vitamins-Minerals (MULTIVITAMIN WITH MINERALS) tablet Take 1 tablet by mouth daily.    . pantoprazole (PROTONIX) 40 MG tablet Take 40 mg by mouth 2 (two) times daily.    . simvastatin (ZOCOR) 20 MG tablet Take 20 mg by mouth at bedtime.    . vitamin B-12 (CYANOCOBALAMIN) 1000 MCG tablet Take 1,000 mcg by mouth daily.    Marland Kitchen guaiFENesin (MUCINEX) 600 MG 12 hr tablet Take 1,200 mg by mouth 2 (two) times daily as needed for to loosen phlegm.     No current facility-administered medications for this encounter.    Allergies  Allergen Reactions  . Celecoxib Shortness Of Breath    unknown  . Fentanyl     rash  . Hydrocodone Other (See Comments)    urinary retention  . Tramadol Other (See Comments)    urinary retention    Social History   Socioeconomic History  . Marital status: Married    Spouse name: Not on file  . Number of children: Not on file  . Years of education: Not on file  . Highest education level: Not on file  Occupational History  . Not on file  Tobacco Use  . Smoking status: Former Smoker    Quit date: 07/16/2003    Years since quitting: 17.0  . Smokeless tobacco: Current User    Types: Chew  Vaping Use  . Vaping Use: Unknown  Substance and Sexual Activity  . Alcohol use: Never  . Drug use: Never  . Sexual activity: Not on file  Other Topics Concern  . Not on file  Social History Narrative  . Not on file   Social Determinants of Health   Financial Resource Strain: Not on file  Food Insecurity: Not on file  Transportation Needs: Not on file  Physical Activity: Not on file  Stress: Not on file  Social Connections: Not on file  Intimate Partner Violence: Not on file     ROS- All systems are reviewed and negative except as per the HPI above.  Physical Exam: Vitals:   08/02/20 1031   BP: 126/70  Pulse: 68  Weight: 73 kg  Height: 5\' 8"  (1.727 m)    GEN- The patient is a well appearing elderly male, alert and oriented x 3 today.   HEENT-head normocephalic, atraumatic, sclera clear, conjunctiva pink, hearing intact, trachea midline. Lungs- Clear to ausculation bilaterally, normal work of breathing Heart- irregular rate and rhythm, no murmurs, rubs or gallops  GI- soft, NT, ND, + BS Extremities- no clubbing, cyanosis, or edema MS- no significant deformity or atrophy Skin- no rash or lesion Psych- euthymic mood, full affect Neuro- strength and sensation are intact   Wt Readings from Last 3 Encounters:  08/02/20 73 kg  07/13/20 72.9 kg  06/29/20 73 kg    EKG today demonstrates  Afib, inc RBBB Vent. rate 68 BPM PR interval * ms QRS duration 100 ms QT/QTcB 416/442 ms  Epic records are reviewed at length today  CHA2DS2-VASc Score = 5  The patient's score is based upon: CHF History: No HTN History: Yes Diabetes History: Yes Stroke History: No Vascular Disease History: Yes Age Score: 2 Gender Score: 0      ASSESSMENT AND PLAN: 1. Persistent Atrial Fibrillation/atrial flutter The patient's CHA2DS2-VASc score is 5, indicating a 7.2% annual risk of stroke.   S/p afib ablation 06/15/20 He remains in rate controlled afib.  Will plan for DCCV now that he has loaded on amiodarone. If he has quick return of afib again, will need follow up with Dr Johney Frame to discuss options.  Check cmet/cbc/TSH Continue amiodarone 200 mg daily Continue Eliquis 5 mg BID Continue Toprol 50 mg daily  2. Secondary Hypercoagulable State (ICD10:  D68.69) The patient is at significant risk for stroke/thromboembolism based upon his CHA2DS2-VASc Score of 5.  Continue Apixaban (Eliquis).   3. CAD S/p CABG No anginal symptoms.  4. HTN Stable, no changes today.   Follow up in the AF clinic post DCCV.    [name] PA-C [facility] [address] [place], [place] [address] [phone] 08/02/2020 10:41 AM

## 2020-08-13 ENCOUNTER — Other Ambulatory Visit (HOSPITAL_COMMUNITY)
Admission: RE | Admit: 2020-08-13 | Discharge: 2020-08-13 | Disposition: A | Discharge status: Home / Self Care | Payer: Medicare Other | Source: Ambulatory Visit | Attending: Cardiology | Admitting: Cardiology

## 2020-08-13 ENCOUNTER — Ambulatory Visit (HOSPITAL_COMMUNITY)
Admission: RE | Admit: 2020-08-13 | Discharge: 2020-08-13 | Disposition: A | Discharge status: Home / Self Care | Payer: Medicare Other | Source: Ambulatory Visit | Attending: Physician Assistant | Admitting: Physician Assistant

## 2020-08-13 ENCOUNTER — Other Ambulatory Visit: Payer: Self-pay

## 2020-08-13 DIAGNOSIS — Z20822 Contact with and (suspected) exposure to covid-19: Secondary | ICD-10-CM | POA: Insufficient documentation

## 2020-08-13 DIAGNOSIS — I4819 Other persistent atrial fibrillation: Secondary | ICD-10-CM

## 2020-08-13 DIAGNOSIS — Z01818 Encounter for other preprocedural examination: Secondary | ICD-10-CM | POA: Diagnosis not present

## 2020-08-13 LAB — SARS CORONAVIRUS 2 (TAT 6-24 HRS): SARS Coronavirus 2: NEGATIVE

## 2020-08-13 NOTE — Progress Notes (Signed)
Patient returns for ECG today. He felt he was back in SR. ECG confirms he is in SR HR 57, PR 182, QRS 100, QTc 436. Will cancel DCCV. Continue present therapy. F/u with Dr Johney Frame as scheduled.

## 2020-08-16 ENCOUNTER — Ambulatory Visit (HOSPITAL_COMMUNITY): Admission: RE | Admit: 2020-08-16 | Payer: Medicare Other | Source: Home / Self Care | Admitting: Cardiology

## 2020-08-16 ENCOUNTER — Encounter (HOSPITAL_COMMUNITY): Admission: RE | Payer: Self-pay | Source: Home / Self Care

## 2020-08-16 SURGERY — CARDIOVERSION
Anesthesia: General

## 2020-08-23 ENCOUNTER — Ambulatory Visit (HOSPITAL_COMMUNITY): Payer: No Typology Code available for payment source | Admitting: Physician Assistant

## 2020-09-13 ENCOUNTER — Ambulatory Visit (INDEPENDENT_AMBULATORY_CARE_PROVIDER_SITE_OTHER): Payer: Medicare Other | Admitting: Internal Medicine

## 2020-09-13 ENCOUNTER — Encounter: Payer: Self-pay | Admitting: Internal Medicine

## 2020-09-13 ENCOUNTER — Other Ambulatory Visit: Payer: Self-pay

## 2020-09-13 VITALS — BP 126/56 | HR 54 | Ht 67.0 in | Wt 157.8 lb

## 2020-09-13 DIAGNOSIS — I25810 Atherosclerosis of coronary artery bypass graft(s) without angina pectoris: Secondary | ICD-10-CM

## 2020-09-13 DIAGNOSIS — I10 Essential (primary) hypertension: Secondary | ICD-10-CM

## 2020-09-13 DIAGNOSIS — I4819 Other persistent atrial fibrillation: Secondary | ICD-10-CM

## 2020-09-13 DIAGNOSIS — Z951 Presence of aortocoronary bypass graft: Secondary | ICD-10-CM

## 2020-09-13 MED ORDER — METOPROLOL SUCCINATE ER 25 MG PO TB24: 25.0000 mg | ORAL_TABLET | Freq: Every day | ORAL | 3 refills | Status: DC

## 2020-09-13 MED ORDER — AMIODARONE HCL 200 MG PO TABS: 200.0000 mg | ORAL_TABLET | Freq: Every day | ORAL | 3 refills | Status: DC

## 2020-09-13 NOTE — Progress Notes (Signed)
PCP: Glori Bickers, MD   Thomas Simon is a 75 y.o. male who presents today for routine electrophysiology followup.  Since his recent afib ablation, the patient reports doing very well.  he denies procedure related complications and is pleased with the results of the procedure.   He did have ERAF early and has been placed on amiodarone which he is tolerating.  Today, he denies symptoms of palpitations, chest pain, shortness of breath,  lower extremity edema, dizziness, presyncope, or syncope.  The patient is otherwise without complaint today.   Past Medical History:  Diagnosis Date   Coronary arteriosclerosis    Diabetes mellitus without complication (HCC)    Hyperlipidemia    Hypertension    Persistent atrial fibrillation (HCC)    PVD (peripheral vascular disease) (HCC)    Thyroid disease    Past Surgical History:  Procedure Laterality Date   ATRIAL FIBRILLATION ABLATION N/A 06/15/2020   Procedure: ATRIAL FIBRILLATION ABLATION;  Surgeon: Hillis Range, MD;  Location: [facility] INVASIVE CV LAB;  Service: Cardiovascular;  Laterality: N/A;   CARDIOVERSION N/A 04/30/2020   Procedure: CARDIOVERSION;  Surgeon: Chilton Si, MD;  Location: [facility] ENDOSCOPY;  Service: Cardiovascular;  Laterality: N/A;   CARDIOVERSION N/A 06/23/2020   Procedure: CARDIOVERSION;  Surgeon: Vesta Mixer, MD;  Location: [facility] ENDOSCOPY;  Service: Cardiovascular;  Laterality: N/A;   CHOLECYSTECTOMY     KNEE ARTHROSCOPY W/ PARTIAL MEDIAL MENISCECTOMY     open heart surgery     ROTATOR CUFF REPAIR      ROS- all systems are personally reviewed and negatives except as per HPI above  Current Outpatient Medications  Medication Sig Dispense Refill   amiodarone (PACERONE) 200 MG tablet Take 1 tablet (200 mg total) by mouth daily. 30 tablet 3   apixaban (ELIQUIS) 5 MG TABS tablet Take 5 mg by mouth 2 (two) times daily.     Cholecalciferol (VITAMIN D3) 50 MCG (2000 UT) TABS Take 2,000 Units by mouth daily.     doxazosin  (CARDURA) 4 MG tablet Take 4 mg by mouth at bedtime.     ferrous sulfate 325 (65 FE) MG tablet Take 325 mg by mouth 2 (two) times daily with a meal.     gabapentin (NEURONTIN) 300 MG capsule Take 300 mg by mouth 2 (two) times daily.     guaiFENesin (MUCINEX) 600 MG 12 hr tablet Take 1,200 mg by mouth 2 (two) times daily as needed for to loosen phlegm.     Krill Oil Omega-3 500 MG CAPS Take 500 mg by mouth daily.     levothyroxine (SYNTHROID) 25 MCG tablet Take 25 mcg by mouth daily before breakfast.     magnesium oxide (MAG-OX) 400 MG tablet Take 400 mg by mouth 2 (two) times daily.     metFORMIN (GLUCOPHAGE) 500 MG tablet Take 500 mg by mouth 2 (two) times daily with a meal.     metoprolol succinate (TOPROL-XL) 50 MG 24 hr tablet Take 50 mg by mouth daily. Take with or immediately following a meal.     Multiple Vitamins-Minerals (MULTIVITAMIN WITH MINERALS) tablet Take 1 tablet by mouth daily.     pantoprazole (PROTONIX) 40 MG tablet Take 40 mg by mouth 2 (two) times daily.     simvastatin (ZOCOR) 20 MG tablet Take 20 mg by mouth at bedtime.     vitamin B-12 (CYANOCOBALAMIN) 1000 MCG tablet Take 1,000 mcg by mouth daily.     No current facility-administered medications for this visit.  Physical Exam: Vitals:   09/13/20 1442  BP: (!) 126/56  Pulse: (!) 54  SpO2: 97%  Weight: 157 lb 12.8 oz (71.6 kg)  Height: 5\' 7"  (1.702 m)    GEN- The patient is well appearing, alert and oriented x 3 today.   Head- normocephalic, atraumatic Eyes-  Sclera clear, conjunctiva pink Ears- hearing intact Oropharynx- clear Lungs- Clear to ausculation bilaterally, normal work of breathing Heart- Regular rate and rhythm, no murmurs, rubs or gallops, PMI not laterally displaced GI- soft, NT, ND, + BS Extremities- no clubbing, cyanosis, or edema  EKG tracing ordered today is personally reviewed and shows sinus bradycardia 54 bpm,left axis deviation  Assessment and Plan:  1. Persistent atrial  fibrillation Doing well s/p ablation chads2vasc score is 5.  Continue eliquis Continue amiodarone We will need to follow him closely on this medicine to avoid toxicity We will plan to reduce amiodarone to 100mg  daily on return if AF is controlled Reduce toprol to 25mg  daily today  2. HTN Stable No change required today  3. CAD s/p CABG No ischemic symptoms No changes   Return to see me in 3 months  MD, [facility] 09/13/2020 2:56 PM

## 2020-09-13 NOTE — Patient Instructions (Addendum)
Medication Instructions:  Reduce Metoprolol Succinate to 25 mg daily Your physician recommends that you continue on your current medications as directed. Please refer to the Current Medication list given to you today.  Labwork: None ordered.  Testing/Procedures: None ordered.  Follow-Up: Your physician wants you to follow-up in: 12/20/20 at 10:30 am with Hillis Range, MD    Any Other Special Instructions Will Be Listed Below (If Applicable).  If you need a refill on your cardiac medications before your next appointment, please call your pharmacy.

## 2020-11-17 ENCOUNTER — Other Ambulatory Visit: Payer: Self-pay | Admitting: Internal Medicine

## 2020-12-20 ENCOUNTER — Other Ambulatory Visit: Payer: Self-pay

## 2020-12-20 ENCOUNTER — Ambulatory Visit (INDEPENDENT_AMBULATORY_CARE_PROVIDER_SITE_OTHER): Payer: Medicare Other | Admitting: Internal Medicine

## 2020-12-20 VITALS — BP 122/60 | HR 70 | Ht 67.0 in | Wt 156.2 lb

## 2020-12-20 DIAGNOSIS — I10 Essential (primary) hypertension: Secondary | ICD-10-CM | POA: Diagnosis not present

## 2020-12-20 DIAGNOSIS — I4819 Other persistent atrial fibrillation: Secondary | ICD-10-CM

## 2020-12-20 DIAGNOSIS — I25810 Atherosclerosis of coronary artery bypass graft(s) without angina pectoris: Secondary | ICD-10-CM

## 2020-12-20 MED ORDER — AMIODARONE HCL 200 MG PO TABS: 100.0000 mg | ORAL_TABLET | Freq: Every day | ORAL | 3 refills | Status: DC

## 2020-12-20 NOTE — Progress Notes (Signed)
PCP: Glori Bickers, MD   Primary EP: Dr Johney Frame  Thomas Simon is a 75 y.o. male who presents today for routine electrophysiology followup.  Since last being seen in our clinic, the patient reports doing very well.  Today, he denies symptoms of palpitations, chest pain, shortness of breath,  lower extremity edema, dizziness, presyncope, or syncope.  The patient is otherwise without complaint today.   Past Medical History:  Diagnosis Date   Coronary arteriosclerosis    Diabetes mellitus without complication (HCC)    Hyperlipidemia    Hypertension    Persistent atrial fibrillation (HCC)    PVD (peripheral vascular disease) (HCC)    Thyroid disease    Past Surgical History:  Procedure Laterality Date   ATRIAL FIBRILLATION ABLATION N/A 06/15/2020   Procedure: ATRIAL FIBRILLATION ABLATION;  Surgeon: Hillis Range, MD;  Location: [facility] INVASIVE CV LAB;  Service: Cardiovascular;  Laterality: N/A;   CARDIOVERSION N/A 04/30/2020   Procedure: CARDIOVERSION;  Surgeon: Chilton Si, MD;  Location[facility] ENDOSCOPY;  Service: Cardiovascular;  Laterality: N/A;   CARDIOVERSION N/A 06/23/2020   Procedure: CARDIOVERSION;  Surgeon: Vesta Mixer, MD;  Location: [facility] ENDOSCOPY;  Service: Cardiovascular;  Laterality: N/A;   CHOLECYSTECTOMY     KNEE ARTHROSCOPY W/ PARTIAL MEDIAL MENISCECTOMY     open heart surgery     ROTATOR CUFF REPAIR      ROS- all systems are reviewed and negatives except as per HPI above  Current Outpatient Medications  Medication Sig Dispense Refill   amiodarone (PACERONE) 200 MG tablet TAKE 1 TABLET BY MOUTH EVERY DAY 90 tablet 1   apixaban (ELIQUIS) 5 MG TABS tablet Take 5 mg by mouth 2 (two) times daily.     Cholecalciferol (VITAMIN D3) 50 MCG (2000 UT) TABS Take 2,000 Units by mouth daily.     doxazosin (CARDURA) 4 MG tablet Take 4 mg by mouth at bedtime.     ferrous sulfate 325 (65 FE) MG tablet Take 325 mg by mouth 2 (two) times daily with a meal.     gabapentin  (NEURONTIN) 300 MG capsule Take 300 mg by mouth 2 (two) times daily.     guaiFENesin (MUCINEX) 600 MG 12 hr tablet Take 1,200 mg by mouth 2 (two) times daily as needed for to loosen phlegm.     Krill Oil Omega-3 500 MG CAPS Take 500 mg by mouth daily.     levothyroxine (SYNTHROID) 25 MCG tablet Take 25 mcg by mouth daily before breakfast.     magnesium oxide (MAG-OX) 400 MG tablet Take 400 mg by mouth 2 (two) times daily.     metFORMIN (GLUCOPHAGE) 500 MG tablet Take 500 mg by mouth 2 (two) times daily with a meal.     metoprolol succinate (TOPROL XL) 25 MG 24 hr tablet Take 1 tablet (25 mg total) by mouth daily. 90 tablet 3   Multiple Vitamins-Minerals (MULTIVITAMIN WITH MINERALS) tablet Take 1 tablet by mouth daily.     pantoprazole (PROTONIX) 40 MG tablet Take 40 mg by mouth 2 (two) times daily.     simvastatin (ZOCOR) 20 MG tablet Take 20 mg by mouth at bedtime.     vitamin B-12 (CYANOCOBALAMIN) 1000 MCG tablet Take 1,000 mcg by mouth daily.     No current facility-administered medications for this visit.    Physical Exam: Vitals:   12/20/20 1042  BP: 122/60  Pulse: 70  SpO2: 95%  Weight: 156 lb 3.2 oz (70.9 kg)  Height: 5\' 7"  (1.702 m)  GEN- The patient is well appearing, alert and oriented x 3 today.   Head- normocephalic, atraumatic Eyes-  Sclera clear, conjunctiva pink Ears- hearing intact Oropharynx- clear Lungs- Clear to ausculation bilaterally, normal work of breathing Heart- Regular rate and rhythm, no murmurs, rubs or gallops, PMI not laterally displaced GI- soft, NT, ND, + BS Extremities- no clubbing, cyanosis, or edema  Wt Readings from Last 3 Encounters:  12/20/20 156 lb 3.2 oz (70.9 kg)  09/13/20 157 lb 12.8 oz (71.6 kg)  08/02/20 161 lb (73 kg)    EKG tracing ordered today is personally reviewed and shows sinus rhythm 70 bpm  Assessment and Plan:  Persistent afib Doing well s/p ablation on amiodarone He did have a fair bit of ERAF. Chads2vasc score  is 5.  He is on eliquis reduce amiodarone to 100mg  daily He reports having labs by PCP recently  2. CAD s/p CABG No ischemic symptoms No changes  3. HTN Stable No change required today   Risks, benefits and potential toxicities for medications prescribed and/or refilled reviewed with patient today.   Return in 3 months  MD, [facility] 12/20/2020 10:49 AM

## 2020-12-20 NOTE — Patient Instructions (Addendum)
Medication Instructions:  Reduce Amiodarone to 100 mg daily  Your physician recommends that you continue on your current medications as directed. Please refer to the Current Medication list given to you today.  Labwork: None ordered.  Testing/Procedures: None ordered.  Follow-Up: Your physician wants you to follow-up in: 03/29/21 at 9:45 am with  Hillis Range, MD    Any Other Special Instructions Will Be Listed Below (If Applicable).  If you need a refill on your cardiac medications before your next appointment, please call your pharmacy.

## 2021-03-29 ENCOUNTER — Encounter: Payer: Self-pay | Admitting: Internal Medicine

## 2021-03-29 ENCOUNTER — Other Ambulatory Visit: Payer: Self-pay

## 2021-03-29 ENCOUNTER — Ambulatory Visit (INDEPENDENT_AMBULATORY_CARE_PROVIDER_SITE_OTHER): Payer: Medicare Other | Admitting: Internal Medicine

## 2021-03-29 VITALS — BP 112/56 | HR 68 | Ht 67.0 in | Wt 154.0 lb

## 2021-03-29 DIAGNOSIS — I4819 Other persistent atrial fibrillation: Secondary | ICD-10-CM

## 2021-03-29 DIAGNOSIS — Z951 Presence of aortocoronary bypass graft: Secondary | ICD-10-CM | POA: Diagnosis not present

## 2021-03-29 DIAGNOSIS — I10 Essential (primary) hypertension: Secondary | ICD-10-CM

## 2021-03-29 DIAGNOSIS — I25810 Atherosclerosis of coronary artery bypass graft(s) without angina pectoris: Secondary | ICD-10-CM | POA: Diagnosis not present

## 2021-03-29 LAB — COMPREHENSIVE METABOLIC PANEL
ALT: 16 IU/L (ref 0–44)
AST: 16 IU/L (ref 0–40)
Albumin/Globulin Ratio: 2 (ref 1.2–2.2)
Albumin: 3.8 g/dL (ref 3.7–4.7)
Alkaline Phosphatase: 85 IU/L (ref 44–121)
BUN/Creatinine Ratio: 23 (ref 10–24)
BUN: 20 mg/dL (ref 8–27)
Bilirubin Total: 0.8 mg/dL (ref 0.0–1.2)
CO2: 25 mmol/L (ref 20–29)
Calcium: 8.6 mg/dL (ref 8.6–10.2)
Chloride: 104 mmol/L (ref 96–106)
Creatinine, Ser: 0.87 mg/dL (ref 0.76–1.27)
Globulin, Total: 1.9 g/dL (ref 1.5–4.5)
Glucose: 135 mg/dL — ABNORMAL HIGH (ref 70–99)
Potassium: 4.2 mmol/L (ref 3.5–5.2)
Sodium: 140 mmol/L (ref 134–144)
Total Protein: 5.7 g/dL — ABNORMAL LOW (ref 6.0–8.5)
eGFR: 89 mL/min/{1.73_m2} (ref >59)

## 2021-03-29 LAB — CBC
Hematocrit: 37.2 % — ABNORMAL LOW (ref 37.5–51.0)
Hemoglobin: 12.8 g/dL — ABNORMAL LOW (ref 13.0–17.7)
MCH: 33.2 pg — ABNORMAL HIGH (ref 26.6–33.0)
MCHC: 34.4 g/dL (ref 31.5–35.7)
MCV: 96 fL (ref 79–97)
Platelets: 140 10*3/uL — ABNORMAL LOW (ref 150–450)
RBC: 3.86 x10E6/uL — ABNORMAL LOW (ref 4.14–5.80)
RDW: 11.9 % (ref 11.6–15.4)
WBC: 4.6 10*3/uL (ref 3.4–10.8)

## 2021-03-29 LAB — TSH: TSH: 3.01 u[IU]/mL (ref 0.450–4.500)

## 2021-03-29 MED ORDER — AMIODARONE HCL 200 MG PO TABS: 100.0000 mg | ORAL_TABLET | ORAL | 3 refills | Status: DC

## 2021-03-29 MED ORDER — NITROGLYCERIN 0.4 MG SL SUBL: 0.4000 mg | SUBLINGUAL_TABLET | SUBLINGUAL | 3 refills | Status: AC | PRN

## 2021-03-29 NOTE — Patient Instructions (Addendum)
Medication Instructions:  Reduce Amiodarone to 100 mg every other day Nitro as needed. Take this medicine by mouth as needed. At the first sign of chest pain or tightness place one tablet under your tongue. Let the tablet dissolve under the tongue. Do not swallow whole.  Do not eat or drink, smoke or chew tobacco while a tablet is dissolving. If you are not better within 5 minutes after taking ONE dose of nitroglycerin, you may take a SECOND dose. IF YOU ARE GETTING TO THE 3RD DOSE CALL 911.  Do not take more than 3 nitroglycerin tablet.    Your physician recommends that you continue on your current medications as directed. Please refer to the Current Medication list given to you today. *If you need a refill on your cardiac medications before your next appointment, please call your pharmacy*  Lab Work: Cmet, TSH, CBC If you have labs (blood work) drawn today and your tests are completely normal, you will receive your results only by: MyChart Message (if you have MyChart) OR A paper copy in the mail If you have any lab test that is abnormal or we need to change your treatment, we will call you to review the results.  Testing/Procedures: None.  Follow-Up: At [facility], you and your health needs are our priority.  As part of our continuing mission to provide you with exceptional heart care, we have created designated Provider Care Teams.  These Care Teams include your primary Cardiologist (physician) and Advanced Practice Providers (APPs -  Physician Assistants and Nurse Practitioners) who all work together to provide you with the care you need, when you need it.  Your physician wants you to follow-up in: 6 months at the Afib Clinic. They will contact you to schedule. 12 months with Dr. Johney Frame.    You will receive a reminder letter in the mail two months in advance. If you don't receive a letter, please call our office to schedule the follow-up appointment.  We recommend signing up for the  patient portal called "MyChart".  Sign up information is provided on this After Visit Summary.  MyChart is used to connect with patients for Virtual Visits (Telemedicine).  Patients are able to view lab/test results, encounter notes, upcoming appointments, etc.  Non-urgent messages can be sent to your provider as well.   To learn more about what you can do with MyChart, go to [email or web address].    Any Other Special Instructions Will Be Listed Below (If Applicable).

## 2021-03-29 NOTE — Progress Notes (Signed)
PCP: Glori Bickers, MD Primary Cardiologist: Dr Earna Coder Primary EP: Dr Johney Frame  Thomas Simon is a 75 y.o. male who presents today for routine electrophysiology followup.  Since last being seen in our clinic, the patient reports doing very well.  Today, he denies symptoms of palpitations, chest pain, shortness of breath,  lower extremity edema, dizziness, presyncope, or syncope.  The patient is otherwise without complaint today.   Past Medical History:  Diagnosis Date   Coronary arteriosclerosis    Diabetes mellitus without complication (HCC)    Hyperlipidemia    Hypertension    Persistent atrial fibrillation (HCC)    PVD (peripheral vascular disease) (HCC)    Thyroid disease    Past Surgical History:  Procedure Laterality Date   ATRIAL FIBRILLATION ABLATION N/A 06/15/2020   Procedure: ATRIAL FIBRILLATION ABLATION;  Surgeon: Hillis Range, MD;  Location: [facility]IVE [facility];  Service: Cardiovascular;  Laterality: N/A;   CARDIOVERSION N/A 04/30/2020   Procedure: CARDIOVERSION;  Surgeon: Chilton Si, MD;  Location: [facility] ENDOSCOPY;  Service: Cardiovascular;  Laterality: N/A;   CARDIOVERSION N/A 06/23/2020   Procedure: CARDIOVERSION;  Surgeon: Vesta Mixer, MD;  Location: [facility] ENDOSCOPY;  Service: Cardiovascular;  Laterality: N/A;   CHOLECYSTECTOMY     KNEE ARTHROSCOPY W/ PARTIAL MEDIAL MENISCECTOMY     open heart surgery     ROTATOR CUFF REPAIR      ROS- all systems are reviewed and negatives except as per HPI above  Current Outpatient Medications  Medication Sig Dispense Refill   amiodarone (PACERONE) 200 MG tablet Take 0.5 tablets (100 mg total) by mouth daily. 90 tablet 3   apixaban (ELIQUIS) 5 MG TABS tablet Take 5 mg by mouth 2 (two) times daily.     Cholecalciferol (VITAMIN D3) 50 MCG (2000 UT) TABS Take 2,000 Units by mouth daily.     doxazosin (CARDURA) 4 MG tablet Take 4 mg by mouth at bedtime.     ferrous sulfate 325 (65 FE) MG tablet Take 325 mg by mouth 2 (two)  times daily with a meal.     gabapentin (NEURONTIN) 300 MG capsule Take 300 mg by mouth 2 (two) times daily.     guaiFENesin (MUCINEX) 600 MG 12 hr tablet Take 1,200 mg by mouth 2 (two) times daily as needed for to loosen phlegm.     Krill Oil Omega-3 500 MG CAPS Take 500 mg by mouth daily.     levothyroxine (SYNTHROID) 25 MCG tablet Take 25 mcg by mouth daily before breakfast.     magnesium oxide (MAG-OX) 400 MG tablet Take 400 mg by mouth 2 (two) times daily.     metFORMIN (GLUCOPHAGE) 500 MG tablet Take 500 mg by mouth 2 (two) times daily with a meal.     metoprolol succinate (TOPROL XL) 25 MG 24 hr tablet Take 1 tablet (25 mg total) by mouth daily. 90 tablet 3   Multiple Vitamins-Minerals (MULTIVITAMIN WITH MINERALS) tablet Take 1 tablet by mouth daily.     pantoprazole (PROTONIX) 40 MG tablet Take 40 mg by mouth 2 (two) times daily.     simvastatin (ZOCOR) 20 MG tablet Take 20 mg by mouth at bedtime.     vitamin B-12 (CYANOCOBALAMIN) 1000 MCG tablet Take 1,000 mcg by mouth daily.     No current facility-administered medications for this visit.    Physical Exam: Vitals:   03/29/21 1003  BP: (!) 112/56  Pulse: 68  SpO2: 94%  Weight: 154 lb (69.9 kg)  Height: 5'  7" (1.702 m)    GEN- The patient is well appearing, alert and oriented x 3 today.   Head- normocephalic, atraumatic Eyes-  Sclera clear, conjunctiva pink Ears- hearing intact Oropharynx- clear Lungs- Clear to ausculation bilaterally, normal work of breathing Heart- Regular rate and rhythm, no murmurs, rubs or gallops, PMI not laterally displaced GI- soft, NT, ND, + BS Extremities- no clubbing, cyanosis, or edema  Wt Readings from Last 3 Encounters:  03/29/21 154 lb (69.9 kg)  12/20/20 156 lb 3.2 oz (70.9 kg)  09/13/20 157 lb 12.8 oz (71.6 kg)    EKG tracing ordered today is personally reviewed and shows sinus  Assessment and Plan:  Persistent afib Doing well post ablation on amiodarone Reduce amiodarone to  100mg  QOD   Chads2vasc score is 5.  He is on eliquis Labs 08/02/20 reviewed Lfts, tfts cbc ordered today  2. CAD s/p CABG No ischemic symptoms No changes Will update his nitrostat He follows with Dr 10/02/20 in [place]  3. HTN Stable No change required today Bmet today  Risks, benefits and potential toxicities for medications prescribed and/or refilled reviewed with patient today.   Return to AF clinic in 6 months I will see in a year  [place] MD, [facility] 03/29/2021 10:08 AM

## 2021-09-28 ENCOUNTER — Encounter (HOSPITAL_COMMUNITY): Payer: Self-pay | Admitting: Nurse Practitioner

## 2021-09-28 ENCOUNTER — Ambulatory Visit (HOSPITAL_COMMUNITY)
Admission: RE | Admit: 2021-09-28 | Discharge: 2021-09-28 | Disposition: A | Discharge status: Home / Self Care | Payer: Medicare Other | Source: Ambulatory Visit | Attending: Nurse Practitioner | Admitting: Nurse Practitioner

## 2021-09-28 VITALS — BP 130/62 | HR 60 | Ht 67.0 in | Wt 151.0 lb

## 2021-09-28 DIAGNOSIS — I2510 Atherosclerotic heart disease of native coronary artery without angina pectoris: Secondary | ICD-10-CM | POA: Diagnosis not present

## 2021-09-28 DIAGNOSIS — Z79899 Other long term (current) drug therapy: Secondary | ICD-10-CM | POA: Insufficient documentation

## 2021-09-28 DIAGNOSIS — I10 Essential (primary) hypertension: Secondary | ICD-10-CM | POA: Insufficient documentation

## 2021-09-28 DIAGNOSIS — F17220 Nicotine dependence, chewing tobacco, uncomplicated: Secondary | ICD-10-CM | POA: Diagnosis not present

## 2021-09-28 DIAGNOSIS — E119 Type 2 diabetes mellitus without complications: Secondary | ICD-10-CM | POA: Insufficient documentation

## 2021-09-28 DIAGNOSIS — E039 Hypothyroidism, unspecified: Secondary | ICD-10-CM | POA: Diagnosis not present

## 2021-09-28 DIAGNOSIS — D6869 Other thrombophilia: Secondary | ICD-10-CM | POA: Insufficient documentation

## 2021-09-28 DIAGNOSIS — I4819 Other persistent atrial fibrillation: Secondary | ICD-10-CM

## 2021-09-28 DIAGNOSIS — Z7901 Long term (current) use of anticoagulants: Secondary | ICD-10-CM | POA: Diagnosis not present

## 2021-09-28 DIAGNOSIS — Z951 Presence of aortocoronary bypass graft: Secondary | ICD-10-CM | POA: Diagnosis not present

## 2021-09-28 DIAGNOSIS — I4892 Unspecified atrial flutter: Secondary | ICD-10-CM | POA: Diagnosis not present

## 2021-09-28 LAB — COMPREHENSIVE METABOLIC PANEL
ALT: 19 U/L (ref 0–44)
AST: 17 U/L (ref 15–41)
Albumin: 3.2 g/dL — ABNORMAL LOW (ref 3.5–5.0)
Alkaline Phosphatase: 61 U/L (ref 38–126)
Anion gap: 6 (ref 5–15)
BUN: 19 mg/dL (ref 8–23)
CO2: 26 mmol/L (ref 22–32)
Calcium: 9.1 mg/dL (ref 8.9–10.3)
Chloride: 108 mmol/L (ref 98–111)
Creatinine, Ser: 0.78 mg/dL (ref 0.61–1.24)
GFR, Estimated: >60 mL/min (ref >60)
Glucose, Bld: 164 mg/dL — ABNORMAL HIGH (ref 70–99)
Potassium: 4.4 mmol/L (ref 3.5–5.1)
Sodium: 140 mmol/L (ref 135–145)
Total Bilirubin: 1.4 mg/dL — ABNORMAL HIGH (ref 0.3–1.2)
Total Protein: 5.9 g/dL — ABNORMAL LOW (ref 6.5–8.1)

## 2021-09-28 LAB — TSH: TSH: 1.954 u[IU]/mL (ref 0.350–4.500)

## 2021-09-28 NOTE — Progress Notes (Signed)
Primary Care Physician: Glori Bickers, MD Primary Cardiologist: Dr Earna Coder Primary Electrophysiologist: Dr Johney Frame Referring Physician: Dr Johney Frame   Thomas Simon is a 76 y.o. male with a history of CAD s/p CABG, diabetes, HTN, hypothyroidism, and atrial fibrillation who presents for follow up in the [facility]. The patient was initially diagnosed with atrial fibrillation 02/2020 after presenting with symptoms of fatigue and SOB. Patient is on Eliquis for a CHADS2VASC score of 5. He underwent DCCV on 04/30/20. Unfortunately, he was back in afib before even leaving the hospital. He does note that he has more fatigue and SOB while in afib. Patient is s/p afib ablation 06/15/20. Unfortunately, he was back in afib about two days post ablation with symptoms of fatigue and lightheadedness. Patient is s/p DCCV on 06/23/20. He only stayed in SR for about 2 days. He was started on amiodarone.  On follow up today, patient reports he is about the same. He did have some SR after starting on amiodarone but has seen only afib the last 2 weeks. He denies any bleeding issues on amiodarone.   F/u in afib clinic, 09/28/21. He saw Dr. Johney Frame 6 months ago at which time amiodarone was decreased to 100 mg qod. He has done well with that maintaining SR. No issues stated today.   Today, he denies symptoms of palpitations, chest pain, orthopnea, PND, lower extremity edema, presyncope, syncope, snoring, daytime somnolence, bleeding, or neurologic sequela. The patient is tolerating medications without difficulties and is otherwise without complaint today.    Atrial Fibrillation Risk Factors:  he does not have symptoms or diagnosis of sleep apnea. he does not have a history of rheumatic fever. he does not have a history of alcohol use.   he has a BMI of Body mass index is 23.65 kg/m.Marland Kitchen Filed Weights   09/28/21 1026  Weight: 68.5 kg    Family History  Problem Relation Age of Onset    Heart failure Mother    Lung disease Father      Atrial Fibrillation Management history:  Previous antiarrhythmic drugs: amiodarone  Previous cardioversions: 04/30/20, 06/23/20 Previous ablations: 06/15/20 CHADS2VASC score: 5 Anticoagulation history: Eliquis   Past Medical History:  Diagnosis Date   Coronary arteriosclerosis    Diabetes mellitus without complication (HCC)    Hyperlipidemia    Hypertension    Persistent atrial fibrillation (HCC)    PVD (peripheral vascular disease) (HCC)    Thyroid disease    Past Surgical History:  Procedure Laterality Date   ATRIAL FIBRILLATION ABLATION N/A 06/15/2020   Procedure: ATRIAL FIBRILLATION ABLATION;  Surgeon: Hillis Range, MD;  Location: [facility];  Service: Cardiovascular;  Laterality: N/A;   CARDIOVERSION N/A 04/30/2020   Procedure: CARDIOVERSION;  Surgeon: Chilton Si, MD;  Location: [facility] ENDOSCOPY;  Service: Cardiovascular;  Laterality: N/A;   CARDIOVERSION N/A 06/23/2020   Procedure: CARDIOVERSION;  Surgeon: Vesta Mixer, MD;  Location: [facility] ENDOSCOPY;  Service: Cardiovascular;  Laterality: N/A;   CHOLECYSTECTOMY     KNEE ARTHROSCOPY W/ PARTIAL MEDIAL MENISCECTOMY     open heart surgery     ROTATOR CUFF REPAIR      Current Outpatient Medications  Medication Sig Dispense Refill   amiodarone (PACERONE) 200 MG tablet Take 0.5 tablets (100 mg total) by mouth every other day. 23 tablet 3   apixaban (ELIQUIS) 5 MG TABS tablet Take 5 mg by mouth 2 (two) times daily.     CALCIUM-MAGNESIUM-ZINC PO Take 3 tablets by mouth daily.  Cholecalciferol (VITAMIN D3) 50 MCG (2000 UT) TABS Take 2,000 Units by mouth daily.     doxazosin (CARDURA) 4 MG tablet Take 4 mg by mouth at bedtime.     doxycycline (MONODOX) 100 MG capsule Take 100 mg by mouth 2 (two) times daily.     ferrous sulfate 325 (65 FE) MG tablet Take 325 mg by mouth 2 (two) times daily with a meal.     gabapentin (NEURONTIN) 300 MG capsule Take 300 mg by mouth 2  (two) times daily.     guaiFENesin (MUCINEX) 600 MG 12 hr tablet Take 1,200 mg by mouth 2 (two) times daily as needed for to loosen phlegm.     Krill Oil Omega-3 500 MG CAPS Take 500 mg by mouth daily.     levothyroxine (SYNTHROID) 25 MCG tablet Take 25 mcg by mouth daily before breakfast.     metoprolol succinate (TOPROL XL) 25 MG 24 hr tablet Take 1 tablet (25 mg total) by mouth daily. 90 tablet 3   Multiple Vitamins-Minerals (MULTIVITAMIN WITH MINERALS) tablet Take 1 tablet by mouth daily.     nitroGLYCERIN (NITROSTAT) 0.4 MG SL tablet Place 1 tablet (0.4 mg total) under the tongue every 5 (five) minutes as needed for chest pain (for chest pain.). first sign of chest pain or tightness place one tablet under your tongue. Let the tablet dissolve under the tongue. Do not swallow whole.  Do not eat or drink, smoke or chew tobacco while a tablet is dissolving. If you are not better within 5 minutes after taking ONE dose of nitroglycerin, you may take a SECOND dose. IF YOU ARE GETTING TO THE 3RD DOSE CALL 911. 25 tablet 3   pantoprazole (PROTONIX) 40 MG tablet Take 40 mg by mouth 2 (two) times daily.     simvastatin (ZOCOR) 20 MG tablet Take 20 mg by mouth at bedtime.     vitamin B-12 (CYANOCOBALAMIN) 1000 MCG tablet Take 1,000 mcg by mouth daily.     triamcinolone (KENALOG) 0.025 % cream Apply topically. (Patient not taking: Reported on 09/28/2021)     No current facility-administered medications for this encounter.    Allergies  Allergen Reactions   Celecoxib Shortness Of Breath    unknown   Fentanyl     rash   Hydrocodone Other (See Comments)    urinary retention   Tramadol Other (See Comments)    urinary retention    Social History   Socioeconomic History   Marital status: Married    Spouse name: Not on file   Number of children: Not on file   Years of education: Not on file   Highest education level: Not on file  Occupational History   Not on file  Tobacco Use   Smoking status:  Former   Smokeless tobacco: Current    Types: Chew  Vaping Use   Vaping Use: Unknown  Substance and Sexual Activity   Alcohol use: Never   Drug use: Never   Sexual activity: Not on file  Other Topics Concern   Not on file  Social History Narrative   Not on file   Social Determinants of Health   Financial Resource Strain: Not on file  Food Insecurity: Not on file  Transportation Needs: Not on file  Physical Activity: Not on file  Stress: Not on file  Social Connections: Not on file  Intimate Partner Violence: Not on file     ROS- All systems are reviewed and negative except as per the  HPI above.  Physical Exam: Vitals:   09/28/21 1026  BP: 130/62  Pulse: 60  Weight: 68.5 kg  Height: 5\' 7"  (1.702 m)    GEN- The patient is a well appearing elderly male, alert and oriented x 3 today.   HEENT-head normocephalic, atraumatic, sclera clear, conjunctiva pink, hearing intact, trachea midline. Lungs- Clear to ausculation bilaterally, normal work of breathing Heart- irregular rate and rhythm, no murmurs, rubs or gallops  GI- soft, NT, ND, + BS Extremities- no clubbing, cyanosis, or edema MS- no significant deformity or atrophy Skin- no rash or lesion Psych- euthymic mood, full affect Neuro- strength and sensation are intact   Wt Readings from Last 3 Encounters:  09/28/21 68.5 kg  03/29/21 69.9 kg  12/20/20 70.9 kg    EKG today demonstrates  Vent. rate 60 BPM PR interval 168 ms QRS duration 100 ms QT/QTcB 422/422 ms P-R-T axes 7 -12 35 Normal sinus rhythm Normal ECG When compared with ECG of 13-Aug-2020 13:10, PREVIOUS ECG IS PRESENT No significant change since last tracing Confirmed by 15-Aug-2020 [phone]) on 09/28/2021 12:07:16 PM  Epic records are reviewed at length today          ASSESSMENT AND PLAN: 1. Persistent Atrial Fibrillation/atrial flutter The patient's CHA2DS2-VASc score is 2  S/p afib ablation 06/15/20 He remains in rate controlled afib  on amiodarone 100 mg qod Cmet/tsh stable checked and stable today  Continue Eliquis 5 mg BID Continue Toprol 25 mg daily  2. Secondary Hypercoagulable State (ICD10:  D68.69) The patient is at significant risk for stroke/thromboembolism based upon his CHA2DS2-VASc Score of 2 .  Continue Apixaban (Eliquis).   3. CAD S/p CABG No anginal symptoms.  4. HTN Stable, no changes today.   Follow up with Dr.Allred in 6 months    06/17/20 C. Lup[facility] [address] [place], [place] [phone]

## 2021-11-15 ENCOUNTER — Other Ambulatory Visit: Payer: Self-pay

## 2021-11-15 ENCOUNTER — Emergency Department (HOSPITAL_COMMUNITY)
Admission: EM | Admit: 2021-11-15 | Discharge: 2021-11-16 | Disposition: A | Discharge status: Home / Self Care | Payer: Medicare Other | Attending: Emergency Medicine | Admitting: Emergency Medicine

## 2021-11-15 ENCOUNTER — Emergency Department (HOSPITAL_COMMUNITY): Payer: Medicare Other

## 2021-11-15 ENCOUNTER — Encounter (HOSPITAL_COMMUNITY): Payer: Self-pay

## 2021-11-15 DIAGNOSIS — R0789 Other chest pain: Secondary | ICD-10-CM | POA: Diagnosis present

## 2021-11-15 DIAGNOSIS — Z7901 Long term (current) use of anticoagulants: Secondary | ICD-10-CM | POA: Insufficient documentation

## 2021-11-15 DIAGNOSIS — Z79899 Other long term (current) drug therapy: Secondary | ICD-10-CM | POA: Insufficient documentation

## 2021-11-15 DIAGNOSIS — I2510 Atherosclerotic heart disease of native coronary artery without angina pectoris: Secondary | ICD-10-CM | POA: Insufficient documentation

## 2021-11-15 DIAGNOSIS — Z951 Presence of aortocoronary bypass graft: Secondary | ICD-10-CM | POA: Diagnosis not present

## 2021-11-15 DIAGNOSIS — R112 Nausea with vomiting, unspecified: Secondary | ICD-10-CM | POA: Diagnosis not present

## 2021-11-15 DIAGNOSIS — R1010 Upper abdominal pain, unspecified: Secondary | ICD-10-CM | POA: Diagnosis not present

## 2021-11-15 LAB — CBC
HCT: 44.3 % (ref 39.0–52.0)
Hemoglobin: 14.9 g/dL (ref 13.0–17.0)
MCH: 33.1 pg (ref 26.0–34.0)
MCHC: 33.6 g/dL (ref 30.0–36.0)
MCV: 98.4 fL (ref 80.0–100.0)
Platelets: 134 10*3/uL — ABNORMAL LOW (ref 150–400)
RBC: 4.5 MIL/uL (ref 4.22–5.81)
RDW: 13.1 % (ref 11.5–15.5)
WBC: 11.6 10*3/uL — ABNORMAL HIGH (ref 4.0–10.5)
nRBC: 0 % (ref 0.0–0.2)

## 2021-11-15 LAB — BASIC METABOLIC PANEL
Anion gap: 9 (ref 5–15)
BUN: 19 mg/dL (ref 8–23)
CO2: 26 mmol/L (ref 22–32)
Calcium: 9 mg/dL (ref 8.9–10.3)
Chloride: 104 mmol/L (ref 98–111)
Creatinine, Ser: 0.79 mg/dL (ref 0.61–1.24)
GFR, Estimated: >60 mL/min (ref >60)
Glucose, Bld: 212 mg/dL — ABNORMAL HIGH (ref 70–99)
Potassium: 4.4 mmol/L (ref 3.5–5.1)
Sodium: 139 mmol/L (ref 135–145)

## 2021-11-15 LAB — TROPONIN I (HIGH SENSITIVITY): Troponin I (High Sensitivity): 6 ng/L (ref <18)

## 2021-11-15 NOTE — ED Triage Notes (Signed)
Pov from home. Cc of emesis and stomach pain, and chest pain since 530pm

## 2021-11-16 DIAGNOSIS — R0789 Other chest pain: Secondary | ICD-10-CM | POA: Diagnosis not present

## 2021-11-16 LAB — TROPONIN I (HIGH SENSITIVITY)
Troponin I (High Sensitivity): 8 ng/L (ref <18)
Troponin I (High Sensitivity): 14 ng/L (ref <18)

## 2021-11-16 MED ORDER — ONDANSETRON 8 MG PO TBDP: ORAL_TABLET | ORAL | 0 refills | Status: DC

## 2021-11-16 NOTE — ED Provider Notes (Signed)
[facility] EMERGENCY DEPARTMENT Provider Note   CSN: [id number] Arrival date & time: 11/15/21  2112     History  Chief Complaint  Patient presents with   Chest Pain    Thomas Simon is a 76 y.o. male.  Patient is a 76 year old male with past medical history of coronary artery disease with CABG greater than 10 years ago.  Patient presenting today with complaints of nausea, vomiting, and chest and abdominal pain.  This started at approximately 5:30 PM shortly after eating a bite of food.  He reports multiple episodes of vomiting along with sharp pains across his chest and upper abdomen.  This lasted for several hours, then seemed to resolve.  Patient states he is now symptom-free.  He denies any recent exertional symptoms.  He does describe being in the heat today working in the yard.  The history is provided by the patient.       Home Medications Prior to Admission medications   Medication Sig Start Date End Date Taking? Authorizing Provider  amiodarone (PACERONE) 200 MG tablet Take 0.5 tablets (100 mg total) by mouth every other day. 03/29/21   Allred, Fayrene Fearing, MD  apixaban (ELIQUIS) 5 MG TABS tablet Take 5 mg by mouth 2 (two) times daily.    [provider]  CALCIUM-MAGNESIUM-ZINC PO Take 3 tablets by mouth daily.    [provider]  Cholecalciferol (VITAMIN D3) 50 MCG (2000 UT) TABS Take 2,000 Units by mouth daily.    [provider]  doxazosin (CARDURA) 4 MG tablet Take 4 mg by mouth at bedtime.    [provider]  doxycycline (MONODOX) 100 MG capsule Take 100 mg by mouth 2 (two) times daily. 09/20/21   [provider]  ferrous sulfate 325 (65 FE) MG tablet Take 325 mg by mouth 2 (two) times daily with a meal.    [provider]  gabapentin (NEURONTIN) 300 MG capsule Take 300 mg by mouth 2 (two) times daily.    [provider]  guaiFENesin (MUCINEX) 600 MG 12 hr tablet Take 1,200 mg by mouth 2 (two) times daily as needed  for to loosen phlegm.    [provider]  [employer] Omega-3 500 MG CAPS Take 500 mg by mouth daily.    [provider]  levothyroxine (SYNTHROID) 25 MCG tablet Take 25 mcg by mouth daily before breakfast.    [provider]  metoprolol succinate (TOPROL XL) 25 MG 24 hr tablet Take 1 tablet (25 mg total) by mouth daily. 09/13/20   Allred, Fayrene Fearing, MD  Multiple Vitamins-Minerals (MULTIVITAMIN WITH MINERALS) tablet Take 1 tablet by mouth daily.    [provider]  nitroGLYCERIN (NITROSTAT) 0.4 MG SL tablet Place 1 tablet (0.4 mg total) under the tongue every 5 (five) minutes as needed for chest pain (for chest pain.). first sign of chest pain or tightness place one tablet under your tongue. Let the tablet dissolve under the tongue. Do not swallow whole.  Do not eat or drink, smoke or chew tobacco while a tablet is dissolving. If you are not better within 5 minutes after taking ONE dose of nitroglycerin, you may take a SECOND dose. IF YOU ARE GETTING TO THE 3RD DOSE CALL 911. 03/29/21   Hillis Range, MD  pantoprazole (PROTONIX) 40 MG tablet Take 40 mg by mouth 2 (two) times daily.    [provider]  simvastatin (ZOCOR) 20 MG tablet Take 20 mg by mouth at bedtime.    [provider]  triamcinolone (KENALOG) 0.025 % cream Apply topically. Patient not taking: Reported on 09/28/2021 09/19/21   [provider]  vitamin B-12 (CYANOCOBALAMIN) 1000 MCG tablet Take 1,000 mcg by mouth daily.    [provider]      Allergies    Celecoxib, Fentanyl, Hydrocodone, and Tramadol    Review of Systems   Review of Systems  All other systems reviewed and are negative.   Physical Exam Updated Vital Signs BP (!) 176/63   Pulse 65   Temp 97.6 F (36.4 C)   Resp 18   Ht 5\' 7"  (1.702 m)   Wt 69.9 kg   SpO2 93%   BMI 24.12 kg/m  Physical Exam Vitals and nursing note reviewed.  Constitutional:      General: He is not in acute distress.     Appearance: He is well-developed. He is not diaphoretic.  HENT:     Head: Normocephalic and atraumatic.  Cardiovascular:     Rate and Rhythm: Normal rate and regular rhythm.     Heart sounds: No murmur heard.    No friction rub.  Pulmonary:     Effort: Pulmonary effort is normal. No respiratory distress.     Breath sounds: Normal breath sounds. No wheezing or rales.  Abdominal:     General: Bowel sounds are normal. There is no distension.     Palpations: Abdomen is soft.     Tenderness: There is no abdominal tenderness.  Musculoskeletal:        General: Normal range of motion.     Cervical back: Normal range of motion and neck supple.     Right lower leg: No tenderness. No edema.     Left lower leg: No tenderness. No edema.  Skin:    General: Skin is warm and dry.  Neurological:     Mental Status: He is alert and oriented to person, place, and time.     Coordination: Coordination normal.     ED Results / Procedures / Treatments   Labs (all labs ordered are listed, but only abnormal results are displayed) Labs Reviewed  BASIC METABOLIC PANEL - Abnormal; Notable for the following components:      Result Value   Glucose, Bld 212 (*)    All other components within normal limits  CBC - Abnormal; Notable for the following components:   WBC 11.6 (*)    Platelets 134 (*)    All other components within normal limits  TROPONIN I (HIGH SENSITIVITY)  TROPONIN I (HIGH SENSITIVITY)  TROPONIN I (HIGH SENSITIVITY)    EKG EKG Interpretation  Date/Time:  Tuesday November 15 2021 21:37:17 EDT Ventricular Rate:  66 PR Interval:  152 QRS Duration: 94 QT Interval:  408 QTC Calculation: 427 R Axis:   -11 Text Interpretation: Normal sinus rhythm Incomplete right bundle branch block Borderline ECG When compared with ECG of 28-Sep-2021 10:49, No significant change was found Confirmed by 30-Sep-2021 (Geoffery Lyons) on 11/16/2021 2:03:46 AM  Radiology DG Chest 2 View  Result Date:  11/15/2021 CLINICAL DATA:  Chest pain EXAM: CHEST - 2 VIEW COMPARISON:  Radiographs 12/28/2017 FINDINGS: Left basilar scarring. No focal consolidation, pleural effusion, or pneumothorax. Normal cardiomediastinal silhouette. Mediastinal and right upper chest surgical clips. No acute osseous abnormality. Postoperative changes right humeral head. IMPRESSION: No active cardiopulmonary disease. Electronically Signed   By: 12/30/2017 M.D.   On: 11/15/2021 22:07    Procedures Procedures    Medications Ordered in ED Medications - No  data to display  ED Course/ Medical Decision Making/ A&P  Patient with history of coronary artery disease with CABG in the past.  He presents today with complaints of nausea, vomiting, and chest and abdominal discomfort that started acutely after eating a bite of food.  He reports multiple episodes of emesis.  Symptoms improving upon arrival to the ER.  Patient's work-up here is unremarkable.  His EKG is unchanged and 3 serial troponins in the ER have all returned negative.  At this point, I strongly suspect a noncardiac etiology.  Disposition discussed with the family.  Patient is not amenable to staying in the hospital and would prefer to go home.  At this point, I feel as though he can safely be discharged with outpatient follow-up and as needed return.  Final Clinical Impression(s) / ED Diagnoses Final diagnoses:  None    Rx / DC Orders ED Discharge Orders     None         Geoffery Lyons, MD 11/16/21 [phone]

## 2021-11-16 NOTE — Discharge Instructions (Signed)
Begin taking Zofran as prescribed as needed for nausea.  Follow-up with primary doctor in the next week, and return to the ER if you develop worsening pain, worsening vomiting, difficulty breathing, or for other new and concerning symptoms.

## 2022-01-11 ENCOUNTER — Encounter: Payer: Self-pay | Admitting: Internal Medicine

## 2022-01-11 ENCOUNTER — Encounter: Payer: Self-pay | Admitting: *Deleted

## 2022-02-01 ENCOUNTER — Ambulatory Visit (INDEPENDENT_AMBULATORY_CARE_PROVIDER_SITE_OTHER): Payer: Medicare Other | Admitting: Gastroenterology

## 2022-02-01 ENCOUNTER — Encounter: Payer: Self-pay | Admitting: Gastroenterology

## 2022-02-01 VITALS — BP 161/72 | HR 64 | Temp 97.7°F | Ht 67.0 in | Wt 150.4 lb

## 2022-02-01 DIAGNOSIS — R197 Diarrhea, unspecified: Secondary | ICD-10-CM | POA: Diagnosis not present

## 2022-02-01 NOTE — Patient Instructions (Signed)
Make sure you do not take other medications within two hours of cholesytramine.  Hold cholesytramine if you shows signs of constipation.  Colonoscopy to be scheduled. Call with any worsening of symptoms in the meantime.

## 2022-02-01 NOTE — Progress Notes (Signed)
GI Office Note    Referring Provider: Glori Bickers, MD Primary Care Physician:  Glori Bickers, MD  Primary Gastroenterologist: Roetta Sessions, MD   Chief Complaint   Chief Complaint  Patient presents with   Colonoscopy    Has issues with diarrhea off and on, last episode was three days ago.     History of Present Illness   Thomas Simon is a 76 y.o. male presenting today at the request of Dr. Brent Bulla for further evaluation of diarrhea. Patient's wife is a patient of Dr. Jena Gauss and wanted to be seen here. Previously evaluated by [facility] several years ago for constipation/fissure, LUQ bulge. CT A/P with contrast 2018 with fluid-filled distention of several bowel loops within LUQ similar to before.  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 H/O remote gastric ulcer in 2013.   Patient presents with complaints of intermittent diarrhea over the past several months. Normally at baseline he has 2-3 stools per day. At times he is having 7-8 stools per day lasting for several weeks at a time. PCP did labs which were "fine". No stool studies. Stopped metformin but made no difference. No ill contacts. He doesn't recall any antibiotic use. He has noted improvement on questran, taking twice daily. Last episode of diarrhea was few days ago. No melena, brbpr. He denies any abdominal pain. No reflux, dysphagia, vomiting.    Medications   Current Outpatient Medications  Medication Sig Dispense Refill   apixaban (ELIQUIS) 5 MG TABS tablet Take 5 mg by mouth 2 (two) times daily.     CALCIUM-MAGNESIUM-ZINC PO Take 3 tablets by mouth daily.     Cholecalciferol (VITAMIN D3) 50 MCG (2000 UT) TABS Take 2,000 Units by mouth daily.     cholestyramine light (PREVALITE) 4 g packet Take 1 packet by mouth 2 (two) times daily.     doxazosin (CARDURA) 4 MG tablet Take 4 mg by mouth at bedtime.     ferrous sulfate 325 (65 FE) MG tablet Take 325 mg by mouth 2 (two) times daily with a meal.      gabapentin (NEURONTIN) 300 MG capsule Take 300 mg by mouth 2 (two) times daily.     guaiFENesin (MUCINEX) 600 MG 12 hr tablet Take 1,200 mg by mouth 2 (two) times daily as needed for to loosen phlegm.     Krill Oil Omega-3 500 MG CAPS Take 500 mg by mouth daily.     levothyroxine (SYNTHROID) 25 MCG tablet Take 25 mcg by mouth daily before breakfast.     metoprolol succinate (TOPROL XL) 25 MG 24 hr tablet Take 1 tablet (25 mg total) by mouth daily. 90 tablet 3   Multiple Vitamins-Minerals (MULTIVITAMIN WITH MINERALS) tablet Take 1 tablet by mouth daily.     nitroGLYCERIN (NITROSTAT) 0.4 MG SL tablet Place 1 tablet (0.4 mg total) under the tongue every 5 (five) minutes as needed for chest pain (for chest pain.). first sign of chest pain or tightness place one tablet under your tongue. Let the tablet dissolve under the tongue. Do not swallow whole.  Do not eat or drink, smoke or chew tobacco while a tablet is dissolving. If you are not better within 5 minutes after taking ONE dose of nitroglycerin, you may take a SECOND dose. IF YOU ARE GETTING TO THE 3RD DOSE CALL 911. 25 tablet 3   pantoprazole (PROTONIX) 40 MG tablet Take 40 mg by mouth 2 (two) times daily.     simvastatin (ZOCOR) 20 MG tablet  Take 20 mg by mouth at bedtime.     vitamin B-12 (CYANOCOBALAMIN) 1000 MCG tablet Take 1,000 mcg by mouth daily.     No current facility-administered medications for this visit.    Allergies   Allergies as of 02/01/2022 - Review Complete 02/01/2022  Allergen Reaction Noted   Celecoxib Shortness Of Breath 04/16/2020   Fentanyl  04/16/2020   Hydrocodone Other (See Comments) 04/16/2020   Tramadol Other (See Comments) 04/16/2020    Past Medical History   Past Medical History:  Diagnosis Date   Coronary arteriosclerosis    Diabetes mellitus without complication (HCC)    Hyperlipidemia    Hypertension    Persistent atrial fibrillation (HCC)    PVD (peripheral vascular disease) ([place])    Thyroid  disease     Past Surgical History   Past Surgical History:  Procedure Laterality Date   ATRIAL FIBRILLATION ABLATION N/A 06/15/2020   Procedure: ATRIAL FIBRILLATION ABLATION;  Surgeon: Thompson Grayer, MD;  Location: [facility];  Service: Cardiovascular;  Laterality: N/A;   CARDIOVERSION N/A 04/30/2020   Procedure: CARDIOVERSION;  Surgeon: Skeet Latch, MD;  Location: [facility] ENDOSCOPY;  Service: Cardiovascular;  Laterality: N/A;   CARDIOVERSION N/A 06/23/2020   Procedure: CARDIOVERSION;  Surgeon: Thayer Headings, MD;  Location: [facility] ENDOSCOPY;  Service: Cardiovascular;  Laterality: N/A;   CHOLECYSTECTOMY     KNEE ARTHROSCOPY W/ PARTIAL MEDIAL MENISCECTOMY     open heart surgery     3vessels   ROTATOR CUFF REPAIR      Past Family History   Family History  Problem Relation Age of Onset   Heart failure Mother    Lung disease Father    Colon cancer Neg Hx     Past Social History   Social History   Socioeconomic History   Marital status: Married    Spouse name: Not on file   Number of children: Not on file   Years of education: Not on file   Highest education level: Not on file  Occupational History   Not on file  Tobacco Use   Smoking status: Former   Smokeless tobacco: Current    Types: Chew  Vaping Use   Vaping Use: Unknown  Substance and Sexual Activity   Alcohol use: Never   Drug use: Never   Sexual activity: Yes  Other Topics Concern   Not on file  Social History Narrative   Not on file   Social Determinants of Health   Financial Resource Strain: Not on file  Food Insecurity: Not on file  Transportation Needs: Not on file  Physical Activity: Not on file  Stress: Not on file  Social Connections: Not on file  Intimate Partner Violence: Not on file    Review of Systems   General: Negative for anorexia, weight loss, fever, chills, fatigue, weakness. Eyes: Negative for vision changes.  ENT: Negative for hoarseness, difficulty swallowing , nasal  congestion. CV: Negative for chest pain, angina, palpitations, dyspnea on exertion, peripheral edema.  Respiratory: Negative for dyspnea at rest, dyspnea on exertion, cough, sputum, wheezing.  GI: See history of present illness. GU:  Negative for dysuria, hematuria, urinary incontinence, urinary frequency, nocturnal urination.  MS: Negative for joint pain, low back pain.  Derm: Negative for rash or itching.  Neuro: Negative for weakness, abnormal sensation, seizure, frequent headaches, memory loss,  confusion.  Psych: Negative for anxiety, depression, suicidal ideation, hallucinations.  Endo: Negative for unusual weight change.  Heme: Negative for bruising or bleeding. Allergy:  Negative for rash or hives.  Physical Exam   BP (!) 161/72 (BP Location: Right Arm, Patient Position: Sitting, Cuff Size: Normal)   Pulse 64   Temp 97.7 F (36.5 C) (Oral)   Ht 5\' 7"  (1.702 m)   Wt 150 lb 6.4 oz (68.2 kg)   SpO2 96%   BMI 23.56 kg/m    General: Well-nourished, well-developed in no acute distress.  Head: Normocephalic, atraumatic.   Eyes: Conjunctiva pink, no icterus. Mouth: Oropharyngeal mucosa moist and pink , no lesions erythema or exudate. Neck: Supple without thyromegaly, masses, or lymphadenopathy.  Lungs: Clear to auscultation bilaterally.  Heart: Regular rate and rhythm, no murmurs rubs or gallops.  Abdomen: Bowel sounds are normal, nontender, nondistended, no hepatosplenomegaly or masses,  no abdominal bruits or hernia, no rebound or guarding.  Rectus diastasis. Rectal: not performed Extremities: No lower extremity edema. No clubbing or deformities.  Neuro: Alert and oriented x 4 , grossly normal neurologically.  Skin: Warm and dry, no rash or jaundice.   Psych: Alert and cooperative, normal mood and affect.  Labs   Lab Results  Component Value Date   CREATININE 0.79 11/15/2021   BUN 19 11/15/2021   NA 139 11/15/2021   K 4.4 11/15/2021   CL 104 11/15/2021   CO2 26  11/15/2021   Lab Results  Component Value Date   WBC 11.6 (H) 11/15/2021   HGB 14.9 11/15/2021   HCT 44.3 11/15/2021   MCV 98.4 11/15/2021   PLT 134 (L) 11/15/2021   Lab Results  Component Value Date   ALT 19 09/28/2021   AST 17 09/28/2021   ALKPHOS 61 09/28/2021   BILITOT 1.4 (H) 09/28/2021   Lab Results  Component Value Date   TSH 1.954 09/28/2021    Imaging Studies   No results found.  Assessment   Chronic intermittent diarrhea: unclear etiology. We will request labs done recently by PCP. Given change in bowels we have offered colonoscopy for further evaluation. Ddx: includes infectious etiology (less likely given chronicity and intermittent nature), IBD, microscopic colitis, malignancy.    PLAN   Colonoscopy with possible random colon biopsies. ASA 3.  I have discussed the risks, alternatives, benefits with regards to but not limited to the risk of reaction to medication, bleeding, infection, perforation and the patient is agreeable to proceed. Written consent to be obtained. Hold Eliquis 48 hours before. We will ask ok from cardiology, he is seen by afib clinic with [facility].  Advised patient to take questran apart from other medications by at least two hours.   [facility]. Leanna Battles, MHS, PA-C [facility]

## 2022-02-05 ENCOUNTER — Telehealth: Payer: Self-pay | Admitting: Gastroenterology

## 2022-02-05 DIAGNOSIS — R197 Diarrhea, unspecified: Secondary | ICD-10-CM | POA: Insufficient documentation

## 2022-02-05 NOTE — Telephone Encounter (Signed)
Can we get approval by cardiology to hold eliquis for 48 hours before colonoscopy? Upon further review, appears he has persistent afib.

## 2022-02-06 ENCOUNTER — Telehealth: Payer: Self-pay

## 2022-02-06 NOTE — Telephone Encounter (Signed)
  Request for patient to stop medication prior to procedure or is needing cleareance  02/06/22  Thomas Simon 1945/10/27  What type of surgery is being performed? Colonoscopy   When is surgery scheduled? TBD  What type of clearance is required (medical or pharmacy to hold medication or both? Medication  Are there any medications that need to be held prior to surgery and how long? Eliquis 48 hours prior  Name of physician performing surgery?  Dr. Lorina [facility] Phone: [phone] Fax: [phone]  Anethesia type (none, local, MAC, general)? MAC

## 2022-02-06 NOTE — Telephone Encounter (Signed)
  Patient Consent for Virtual Visit         Thomas Simon has provided verbal consent on 02/06/2022 for a virtual visit (video or telephone).   CONSENT FOR VIRTUAL VISIT FOR:  Thomas Simon  By participating in this virtual visit I agree to the following:  I hereby voluntarily request, consent and authorize [facility] and its employed or contracted physicians, physician assistants, nurse practitioners or other licensed health care professionals (the Practitioner), to provide me with telemedicine health care services (the "Services") as deemed necessary by the treating Practitioner. I acknowledge and consent to receive the Services by the Practitioner via telemedicine. I understand that the telemedicine visit will involve communicating with the Practitioner through live audiovisual communication technology and the disclosure of certain medical information by electronic transmission. I acknowledge that I have been given the opportunity to request an in-person assessment or other available alternative prior to the telemedicine visit and am voluntarily participating in the telemedicine visit.  I understand that I have the right to withhold or withdraw my consent to the use of telemedicine in the course of my care at any time, without affecting my right to future care or treatment, and that the Practitioner or I may terminate the telemedicine visit at any time. I understand that I have the right to inspect all information obtained and/or recorded in the course of the telemedicine visit and may receive copies of available information for a reasonable fee.  I understand that some of the potential risks of receiving the Services via telemedicine include:  Delay or interruption in medical evaluation due to technological equipment failure or disruption; Information transmitted may not be sufficient (e.g. poor resolution of images) to allow for appropriate medical decision making by the Practitioner;  and/or  In rare instances, security protocols could fail, causing a breach of personal health information.  Furthermore, I acknowledge that it is my responsibility to provide information about my medical history, conditions and care that is complete and accurate to the best of my ability. I acknowledge that Practitioner's advice, recommendations, and/or decision may be based on factors not within their control, such as incomplete or inaccurate data provided by me or distortions of diagnostic images or specimens that may result from electronic transmissions. I understand that the practice of medicine is not an exact science and that Practitioner makes no warranties or guarantees regarding treatment outcomes. I acknowledge that a copy of this consent can be made available to me via my patient portal ([facility]), or I can request a printed copy by calling the office of [employer].    I understand that my insurance will be billed for this visit.   I have read or had this consent read to me. I understand the contents of this consent, which adequately explains the benefits and risks of the Services being provided via telemedicine.  I have been provided ample opportunity to ask questions regarding this consent and the Services and have had my questions answered to my satisfaction. I give my informed consent for the services to be provided through the use of telemedicine in my medical care

## 2022-02-06 NOTE — Telephone Encounter (Signed)
Patient with diagnosis of atrial fibrillation on Eliquis for anticoagulation.    Procedure: colonoscopy Date of procedure: TBD   CHA2DS2-VASc Score = 5   This indicates a 7.2% annual risk of stroke. The patient's score is based upon: CHF History: 0 HTN History: 1 Diabetes History: 1 Stroke History: 0 Vascular Disease History: 1 Age Score: 2 Gender Score: 0    CrCl 76 Platelet count 134  Per office protocol, patient can hold Eliquis for 2 days prior to procedure.   Patient will not need bridging with Lovenox (enoxaparin) around procedure.  **This guidance is not considered finalized until pre-operative APP has relayed final recommendations.**

## 2022-02-06 NOTE — Telephone Encounter (Signed)
   Name: Thomas Simon  DOB: 30-Dec-1945  MRN: [id number]  Primary Cardiologist: None   Preoperative team, please contact this patient and set up a phone call appointment for further preoperative risk assessment. Please obtain consent and complete medication review. Thank you for your help.  I confirm that guidance regarding antiplatelet and oral anticoagulation therapy has been completed and, if necessary, noted below.  Pharmacy has addressed anticoagulation request   Deberah Pelton, NP 02/06/2022, 3:36 PM [place]

## 2022-02-06 NOTE — Telephone Encounter (Signed)
Patient contact and telehealth appt has been made. Patient agreaable and voiced understanding.

## 2022-02-13 ENCOUNTER — Ambulatory Visit: Payer: Medicare Other | Attending: Cardiovascular Disease | Admitting: Physician Assistant

## 2022-02-13 DIAGNOSIS — Z01810 Encounter for preprocedural cardiovascular examination: Secondary | ICD-10-CM | POA: Diagnosis not present

## 2022-02-13 NOTE — Progress Notes (Signed)
Virtual Visit via Telephone Note   Because of Thomas Simon's co-morbid illnesses, he is at least at moderate risk for complications without adequate follow up.  This format is felt to be most appropriate for this patient at this time.  The patient did not have access to video technology/had technical difficulties with video requiring transitioning to audio format only (telephone).  All issues noted in this document were discussed and addressed.  No physical exam could be performed with this format.  Please refer to the patient's chart for his consent to telehealth for [facility].  Evaluation Performed:  Preoperative cardiovascular risk assessment _____________   Date:  02/13/2022   Patient ID:  Thomas Simon, DOB 07/16/1945, MRN [id number] Patient Location:  Home Provider location:   Office  Primary Care Provider:  Glori Bickers, MD Primary Cardiologist:  None  Chief Complaint / Patient Profile   76 y.o. y/o male with a h/o coronary arteriosclerosis, diabetes mellitus, hyperlipidemia, hypertension, persistent atrial fibrillation, PVD, and thyroid disease who is pending colonoscopy and presents today for telephonic preoperative cardiovascular risk assessment.  Past Medical History    Past Medical History:  Diagnosis Date   Coronary arteriosclerosis    Diabetes mellitus without complication (HCC)    Hyperlipidemia    Hypertension    Persistent atrial fibrillation (HCC)    PVD (peripheral vascular disease) (HCC)    Thyroid disease    Past Surgical History:  Procedure Laterality Date   ATRIAL FIBRILLATION ABLATION N/A 06/15/2020   Procedure: ATRIAL FIBRILLATION ABLATION;  Surgeon: Thomas Range, MD;  Location: [facility]VASIVE [facility] LAB;  Service: Cardiovascular;  Laterality: N/A;   CARDIOVERSION N/A 04/30/2020   Procedure: CARDIOVERSION;  Surgeon: Thomas Si, MD;  Location: [facility] ENDOSCOPY;  Service: Cardiovascular;  Laterality: N/A;   CARDIOVERSION N/A 06/23/2020    Procedure: CARDIOVERSION;  Surgeon: Thomas Mixer, MD;  Location: [facility] ENDOSCOPY;  Service: Cardiovascular;  Laterality: N/A;   CHOLECYSTECTOMY     KNEE ARTHROSCOPY W/ PARTIAL MEDIAL MENISCECTOMY     open heart surgery     3vessels   ROTATOR CUFF REPAIR      Allergies  Allergies  Allergen Reactions   Celecoxib Shortness Of Breath    unknown   Fentanyl     rash   Hydrocodone Other (See Comments)    urinary retention   Tramadol Other (See Comments)    urinary retention    History of Present Illness    Thomas Simon is a 76 y.o. male who presents via audio/video conferencing for a telehealth visit today.  Pt was last seen in cardiology clinic on 03/29/22 by Dr. Johney Simon.  At that time Thomas Simon was doing well.  The patient is now pending procedure as outlined above. Since his last visit, he tells me that he stays very active and does everything that he wants to do.  He is felt perfect from a cardiovascular standpoint.  He has not had any issues with his atrial fibrillation after his ablation.  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  He has no issues with outdoor and indoor work.  He could even run a short distance if need be.  For this reason he scored a 6.61 METS on the DASI.  This well exceeds the minimum 4 METS requirement.  His recreational activities include fishing and hunting.  We discussed that it is okay to hold his Eliquis 2 days prior to his procedure.  He can resume when medically safe to do  so.  Home Medications    Prior to Admission medications   Medication Sig Start Date End Date Taking? Authorizing Provider  apixaban (ELIQUIS) 5 MG TABS tablet Take 5 mg by mouth 2 (two) times daily.    [provider]  CALCIUM-MAGNESIUM-ZINC PO Take 3 tablets by mouth daily.    [provider]  Cholecalciferol (VITAMIN D3) 50 MCG (2000 UT) TABS Take 2,000 Units by mouth daily.    [provider]  cholestyramine light (PREVALITE) 4 g packet Take 1  packet by mouth 2 (two) times daily. 12/15/21   [provider]  doxazosin (CARDURA) 4 MG tablet Take 4 mg by mouth at bedtime.    [provider]  ferrous sulfate 325 (65 FE) MG tablet Take 325 mg by mouth 2 (two) times daily with a meal.    [provider]  gabapentin (NEURONTIN) 300 MG capsule Take 300 mg by mouth 2 (two) times daily.    [provider]  guaiFENesin (MUCINEX) 600 MG 12 hr tablet Take 1,200 mg by mouth 2 (two) times daily as needed for to loosen phlegm.    [provider]  Thomas Simon Omega-3 500 MG CAPS Take 500 mg by mouth daily.    [provider]  levothyroxine (SYNTHROID) 25 MCG tablet Take 25 mcg by mouth daily before breakfast.    [provider]  metoprolol succinate (TOPROL XL) 25 MG 24 hr tablet Take 1 tablet (25 mg total) by mouth daily. 09/13/20   Thomas Simon, Thomas Rinks, MD  Multiple Vitamins-Minerals (MULTIVITAMIN WITH MINERALS) tablet Take 1 tablet by mouth daily.    [provider]  nitroGLYCERIN (NITROSTAT) 0.4 MG SL tablet Place 1 tablet (0.4 mg total) under the tongue every 5 (five) minutes as needed for chest pain (for chest pain.). first sign of chest pain or tightness place one tablet under your tongue. Let the tablet dissolve under the tongue. Do not swallow whole.  Do not eat or drink, smoke or chew tobacco while a tablet is dissolving. If you are not better within 5 minutes after taking ONE dose of nitroglycerin, you may take a SECOND dose. IF YOU ARE GETTING TO THE 3RD DOSE CALL 911. 03/29/21   Thomas Grayer, MD  pantoprazole (PROTONIX) 40 MG tablet Take 40 mg by mouth 2 (two) times daily.    [provider]  sildenafil (REVATIO) 20 MG tablet Take 60 mg by mouth daily as needed. 09/23/21   [provider]  simvastatin (ZOCOR) 20 MG tablet Take 20 mg by mouth at bedtime.    [provider]  vitamin B-12 (CYANOCOBALAMIN) 1000 MCG tablet Take 1,000 mcg by mouth daily.     [provider]    Physical Exam    Vital Signs:  Thomas Simon does not have vital signs available for review today.  Given telephonic nature of communication, physical exam is limited. AAOx3. NAD. Normal affect.  Speech and respirations are unlabored.  Accessory Clinical Findings    None  Assessment & Plan    1.  Preoperative Cardiovascular Risk Assessment:  Mr. Galler perioperative risk of a major cardiac event is 0.9% according to the Revised Cardiac Risk Index (RCRI).  Therefore, he is at low risk for perioperative complications.   His functional capacity is good at 6.61 METs according to the [facility] Activity Status Index (DASI). Recommendations: According to ACC/AHA guidelines, no further cardiovascular testing needed.  The patient may proceed to surgery at acceptable risk.   Antiplatelet and/or Anticoagulation  Recommendations:  Eliquis (Apixaban) can be held for 2 days prior to surgery.  Please resume post op when felt to be safe.    A copy of this note will be routed to requesting surgeon.  Time:   Today, I have spent 8 minutes with the patient with telehealth technology discussing medical history, symptoms, and management plan.     Elgie Collard, PA-C  02/13/2022, 12:56 PM

## 2022-02-14 NOTE — Telephone Encounter (Signed)
Cardiology has cleared patient and ok to hold eliquis 48 hours before colonoscopy. Please schedule as per previous orders.

## 2022-02-14 NOTE — Telephone Encounter (Signed)
See cardiology note from 02/13/22. Thanks!

## 2022-02-15 ENCOUNTER — Encounter: Payer: Self-pay | Admitting: *Deleted

## 2022-02-15 MED ORDER — PEG 3350-KCL-NA BICARB-NACL 420 G PO SOLR: 4000.0000 mL | Freq: Once | ORAL | 0 refills | Status: AC

## 2022-02-15 MED ORDER — PEG 3350-KCL-NA BICARB-NACL 420 G PO SOLR: 4000.0000 mL | Freq: Once | ORAL | 0 refills | Status: DC

## 2022-02-15 NOTE — Addendum Note (Signed)
Addended by: Armstead Peaks on: 02/15/2022 02:55 PM   Modules accepted: Orders

## 2022-02-15 NOTE — Telephone Encounter (Signed)
Called pt. He wanted to schedule ASAP. On for with Dr. Jena Gauss 11/2 at 12:15pm. Aware will send instructions via mychart. Rx for prep sent to pharmacy. Aware will need to hold iron x 7 days, cholestyramine x 5 days, eliquis x 2 days. He voiced understanding

## 2022-02-20 ENCOUNTER — Encounter (HOSPITAL_COMMUNITY): Payer: Self-pay

## 2022-02-20 ENCOUNTER — Encounter (HOSPITAL_COMMUNITY)
Admission: RE | Admit: 2022-02-20 | Discharge: 2022-02-20 | Disposition: A | Discharge status: Home / Self Care | Payer: Medicare Other | Source: Ambulatory Visit | Attending: Internal Medicine | Admitting: Internal Medicine

## 2022-02-20 HISTORY — DX: Anemia, unspecified: D64.9

## 2022-02-20 HISTORY — DX: Gastro-esophageal reflux disease without esophagitis: K21.9

## 2022-02-20 NOTE — Patient Instructions (Signed)
Thomas Simon  02/20/2022     @PREFPERIOPPHARMACY @   Your procedure is scheduled on 02/22/22.  Report to [facility] at 10:15 A.M.  Call this number if you have problems the morning of surgery:  [phone]  If you experience any cold or flu symptoms such as cough, fever, chills, shortness of breath, etc. between now and your scheduled surgery, please notify us at the above number.   Remember:   Please follow the diet and prep instructions given to yo by Dr Roseanne Kaufman office.      Take these medicines the morning of surgery with A SIP OF WATER : Gabapentin Levothyroxine Metoprolol Protonix    Do not wear jewelry, make-up or nail polish.  Do not wear lotions, powders, or perfumes, or deodorant.  Do not shave 48 hours prior to surgery.  Men may shave face and neck.  Do not bring valuables to the hospital.  [facility] is not responsible for any belongings or valuables.  Contacts, dentures or bridgework may not be worn into surgery.  Leave your suitcase in the car.  After surgery it may be brought to your room.  For patients admitted to the hospital, discharge time will be determined by your treatment team.  Patients discharged the day of surgery will not be allowed to drive home.   Name and phone number of your driver:   Family Special instructions:  N/A  Please read over the following fact sheets that you were given. Care and Recovery After Surgery  Monitored Anesthesia Care Anesthesia refers to the techniques, procedures, and medicines that help a person stay safe and comfortable during surgery. Monitored anesthesia care, or sedation, is one type of anesthesia. You may have sedation if you do not need to be asleep for your procedure. Procedures that use sedation may include: Surgery to remove cataracts from your eyes. A dental procedure. A biopsy. This is when a tissue sample is removed and looked at under a microscope. You will be watched closely during your procedure. Your  level of sedation or type of anesthesia may be changed to fit your needs. Tell a health care provider about: Any allergies you have. All medicines you are taking, including vitamins, herbs, eye drops, creams, and over-the-counter medicines. Any problems you or family members have had with anesthesia. Any bleeding problems you have. Any surgeries you have had. Any medical conditions or illnesses you have. This includes sleep apnea, cough, fever, or the flu. Whether you are pregnant or may be pregnant. Whether you use cigarettes, alcohol, or drugs. Any use of steroids, whether by mouth or as a cream. What are the risks? Your health care provider will talk with you about risks. These may include: Getting too much medicine (oversedation). Nausea. Allergic reactions to medicines. Trouble breathing. If this happens, a breathing tube may be used to help you breathe. It will be removed when you are awake and breathing on your own. Heart trouble. Lung trouble. Confusion that gets better with time (emergence delirium). What happens before the procedure? When to stop eating and drinking Follow instructions from your health care provider about what you may eat and drink. These may include: 8 hours before your procedure Stop eating most foods. Do not eat meat, fried foods, or fatty foods. Eat only light foods, such as toast or crackers. All liquids are okay except energy drinks and alcohol. 6 hours before your procedure Stop eating. Drink only clear liquids, such as water, clear fruit juice, black coffee, plain  tea, and sports drinks. Do not drink energy drinks or alcohol. 2 hours before your procedure Stop drinking all liquids. You may be allowed to take medicines with small sips of water. If you do not follow your health care provider's instructions, your procedure may be delayed or canceled. Medicines Ask your health care provider about: Changing or stopping your regular medicines. These  include any diabetes medicines or blood thinners you take. Taking medicines such as aspirin and ibuprofen. These medicines can thin your blood. Do not take them unless your health care provider tells you to. Taking over-the-counter medicines, vitamins, herbs, and supplements. Testing You may have an exam or testing. You may have a blood or urine sample taken. General instructions Do not use any products that contain nicotine or tobacco for at least 4 weeks before the procedure. These products include cigarettes, chewing tobacco, and vaping devices, such as e-cigarettes. If you need help quitting, ask your health care provider. If you will be going home right after the procedure, plan to have a responsible adult: Take you home from the hospital or clinic. You will not be allowed to drive. Care for you for the time you are told. What happens during the procedure?  Your blood pressure, heart rate, breathing, level of pain, and blood oxygen level will be monitored. An IV will be inserted into one of your veins. You may be given: A sedative. This helps you relax. Anesthesia. This will: Numb certain areas of your body. Make you fall asleep for surgery. You will be given medicines as needed to keep you comfortable. The more medicine you are given, the deeper your level of sedation will be. Your level of sedation may be changed to fit your needs. There are three levels of sedation: Mild sedation. At this level, you may feel awake and relaxed. You will be able to follow directions. Moderate sedation. At this level, you will be sleepy. You may not remember the procedure. Deep sedation. At this level, you will be asleep. You will not remember the procedure. How you get the medicines will depend on your age and the procedure. They may be given as: A pill. This may be taken by mouth (orally) or inserted into the rectum. An injection. This may be into a vein or muscle. A spray through the nose. After  your procedure is over, the medicine will be stopped. The procedure may vary among health care providers and hospitals. What happens after the procedure? Your blood pressure, heart rate, breathing rate, and blood oxygen level will be monitored until you leave the hospital or clinic. You may feel sleepy, clumsy, or nauseous. You may not remember what happened during or after the procedure. Sedation can affect you for several hours. Do not drive or use machinery until your health care provider says that it is safe. This information is not intended to replace advice given to you by your health care provider. Make sure you discuss any questions you have with your health care provider. Document Revised: 08/15/2021 Document Reviewed: 08/15/2021 Elsevier Patient Education  [place]. Colonoscopy, Adult A colonoscopy is a procedure to look at the entire large intestine. This procedure is done using a long, thin, flexible tube that has a camera on the end. You may have a colonoscopy: As a part of normal colorectal screening. If you have certain symptoms, such as: A low number of red blood cells in your blood (anemia). Diarrhea that does not go away. Pain in your abdomen. Blood  in your stool. A colonoscopy can help screen for and diagnose medical problems, including: An abnormal growth of cells or tissue (tumor). Abnormal growths within the lining of your intestine (polyps). Inflammation. Areas of bleeding. Tell your health care provider about: Any allergies you have. All medicines you are taking, including vitamins, herbs, eye drops, creams, and over-the-counter medicines. Any problems you or family members have had with anesthetic medicines. Any bleeding problems you have. Any surgeries you have had. Any medical conditions you have. Any problems you have had with having bowel movements. Whether you are pregnant or may be pregnant. What are the risks? Generally, this is a safe  procedure. However, problems may occur, including: Bleeding. Damage to your intestine. Allergic reactions to medicines given during the procedure. Infection. This is rare. What happens before the procedure? Eating and drinking restrictions Follow instructions from your health care provider about eating or drinking restrictions, which may include: A few days before the procedure: Follow a low-fiber diet. Avoid nuts, seeds, dried fruit, raw fruits, and vegetables. 1-3 days before the procedure: Eat only gelatin dessert or ice pops. Drink only clear liquids, such as water, clear juice, clear broth or bouillon, black coffee or tea, or clear soft drinks or sports drinks. Avoid liquids that contain red or purple dye. The day of the procedure: Do not eat solid foods. You may continue to drink clear liquids until up to 2 hours before the procedure. Do not eat or drink anything starting 2 hours before the procedure, or within the time period that your health care provider recommends. Bowel prep If you were prescribed a bowel prep to take by mouth (orally) to clean out your colon: Take it as told by your health care provider. Starting the day before your procedure, you will need to drink a large amount of liquid medicine. The liquid will cause you to have many bowel movements of loose stool until your stool becomes almost clear or light green. If your skin or the opening between the buttocks (anus) gets irritated from diarrhea, you may relieve the irritation using: Wipes with medicine in them, such as adult wet wipes with aloe and vitamin E. A product to soothe skin, such as petroleum jelly. If you vomit while drinking the bowel prep: Take a break for up to 60 minutes. Begin the bowel prep again. Call your health care provider if you keep vomiting or you cannot take the bowel prep without vomiting. To clean out your colon, you may also be given: Laxative medicines. These help you have a bowel  movement. Instructions for enema use. An enema is liquid medicine injected into your rectum. Medicines Ask your health care provider about: Changing or stopping your regular medicines or supplements. This is especially important if you are taking iron supplements, diabetes medicines, or blood thinners. Taking medicines such as aspirin and ibuprofen. These medicines can thin your blood. Do not take these medicines unless your health care provider tells you to take them. Taking over-the-counter medicines, vitamins, herbs, and supplements. General instructions Ask your health care provider what steps will be taken to help prevent infection. These may include washing skin with a germ-killing soap. If you will be going home right after the procedure, plan to have a responsible adult: Take you home from the hospital or clinic. You will not be allowed to drive. Care for you for the time you are told. What happens during the procedure?  An IV will be inserted into one of your veins. You  will be given a medicine to make you fall asleep (general anesthetic). You will lie on your side with your knees bent. A lubricant will be put on the tube. Then the tube will be: Inserted into your anus. Gently eased through all parts of your large intestine. Air will be sent into your colon to keep it open. This may cause some pressure or cramping. Images will be taken with the camera and will appear on a screen. A small tissue sample may be removed to be looked at under a microscope (biopsy). The tissue may be sent to a lab for testing if any signs of problems are found. If small polyps are found, they may be removed and checked for cancer cells. When the procedure is finished, the tube will be removed. The procedure may vary among health care providers and hospitals. What happens after the procedure? Your blood pressure, heart rate, breathing rate, and blood oxygen level will be monitored until you leave the  hospital or clinic. You may have a small amount of blood in your stool. You may pass gas and have mild cramping or bloating in your abdomen. This is caused by the air that was used to open your colon during the exam. If you were given a sedative during the procedure, it can affect you for several hours. Do not drive or operate machinery until your health care provider says that it is safe. It is up to you to get the results of your procedure. Ask your health care provider, or the department that is doing the procedure, when your results will be ready. Summary A colonoscopy is a procedure to look at the entire large intestine. Follow instructions from your health care provider about eating and drinking before the procedure. If you were prescribed an oral bowel prep to clean out your colon, take it as told by your health care provider. During the colonoscopy, a flexible tube with a camera on its end is inserted into the anus and then passed into all parts of the large intestine. This information is not intended to replace advice given to you by your health care provider. Make sure you discuss any questions you have with your health care provider. Document Revised: 03/14/2021 Document Reviewed: 11/10/2020 Elsevier Patient Education  [place].

## 2022-02-22 ENCOUNTER — Ambulatory Visit (HOSPITAL_BASED_OUTPATIENT_CLINIC_OR_DEPARTMENT_OTHER): Payer: Medicare Other | Admitting: Anesthesiology

## 2022-02-22 ENCOUNTER — Encounter (HOSPITAL_COMMUNITY): Payer: Self-pay | Admitting: Internal Medicine

## 2022-02-22 ENCOUNTER — Ambulatory Visit (HOSPITAL_COMMUNITY)
Admission: RE | Admit: 2022-02-22 | Discharge: 2022-02-22 | Disposition: A | Discharge status: Home / Self Care | Payer: Medicare Other | Attending: Internal Medicine | Admitting: Internal Medicine

## 2022-02-22 ENCOUNTER — Encounter (HOSPITAL_COMMUNITY)
Admission: RE | Disposition: A | Discharge status: Home / Self Care | Payer: Self-pay | Source: Home / Self Care | Attending: Internal Medicine

## 2022-02-22 ENCOUNTER — Ambulatory Visit (HOSPITAL_COMMUNITY): Payer: Medicare Other | Admitting: Anesthesiology

## 2022-02-22 DIAGNOSIS — I2510 Atherosclerotic heart disease of native coronary artery without angina pectoris: Secondary | ICD-10-CM | POA: Insufficient documentation

## 2022-02-22 DIAGNOSIS — K5730 Diverticulosis of large intestine without perforation or abscess without bleeding: Secondary | ICD-10-CM

## 2022-02-22 DIAGNOSIS — D649 Anemia, unspecified: Secondary | ICD-10-CM | POA: Diagnosis not present

## 2022-02-22 DIAGNOSIS — Z7901 Long term (current) use of anticoagulants: Secondary | ICD-10-CM | POA: Insufficient documentation

## 2022-02-22 DIAGNOSIS — K6289 Other specified diseases of anus and rectum: Secondary | ICD-10-CM | POA: Diagnosis not present

## 2022-02-22 DIAGNOSIS — K529 Noninfective gastroenteritis and colitis, unspecified: Secondary | ICD-10-CM | POA: Diagnosis present

## 2022-02-22 DIAGNOSIS — I4891 Unspecified atrial fibrillation: Secondary | ICD-10-CM | POA: Diagnosis not present

## 2022-02-22 DIAGNOSIS — I739 Peripheral vascular disease, unspecified: Secondary | ICD-10-CM | POA: Diagnosis not present

## 2022-02-22 DIAGNOSIS — K219 Gastro-esophageal reflux disease without esophagitis: Secondary | ICD-10-CM | POA: Diagnosis not present

## 2022-02-22 DIAGNOSIS — Z8249 Family history of ischemic heart disease and other diseases of the circulatory system: Secondary | ICD-10-CM | POA: Insufficient documentation

## 2022-02-22 DIAGNOSIS — Z87891 Personal history of nicotine dependence: Secondary | ICD-10-CM

## 2022-02-22 DIAGNOSIS — I10 Essential (primary) hypertension: Secondary | ICD-10-CM | POA: Insufficient documentation

## 2022-02-22 HISTORY — PX: BIOPSY: SHX5522

## 2022-02-22 HISTORY — PX: COLONOSCOPY WITH PROPOFOL: SHX5780

## 2022-02-22 SURGERY — COLONOSCOPY WITH PROPOFOL
Anesthesia: General

## 2022-02-22 MED ORDER — LIDOCAINE HCL (CARDIAC) PF 100 MG/5ML IV SOSY
PREFILLED_SYRINGE | INTRAVENOUS | Status: DC | PRN
  Administered 2022-02-22: 60 mg via INTRAVENOUS

## 2022-02-22 MED ORDER — LACTATED RINGERS IV SOLN: INTRAVENOUS | Status: DC | PRN

## 2022-02-22 MED ORDER — STERILE WATER FOR IRRIGATION IR SOLN
Status: DC | PRN
  Administered 2022-02-22: 50 mL

## 2022-02-22 MED ORDER — LACTATED RINGERS IV SOLN: INTRAVENOUS | Status: DC

## 2022-02-22 MED ORDER — PROPOFOL 10 MG/ML IV BOLUS
INTRAVENOUS | Status: DC | PRN
  Administered 2022-02-22: 30 mg via INTRAVENOUS
  Administered 2022-02-22: 50 mg via INTRAVENOUS
  Administered 2022-02-22 (×3): 10 mg via INTRAVENOUS

## 2022-02-22 NOTE — Transfer of Care (Signed)
Immediate Anesthesia Transfer of Care Note  Patient: Thomas Simon  Procedure(s) Performed: COLONOSCOPY WITH PROPOFOL  Patient Location: PACU  Anesthesia Type:General  Level of Consciousness: awake, alert , oriented, and patient cooperative  Airway & Oxygen Therapy: Patient Spontanous Breathing and Patient connected to nasal cannula oxygen  Post-op Assessment: Report given to RN, Post -op Vital signs reviewed and stable, and Patient moving all extremities  Post vital signs: Reviewed and stable  Last Vitals:  Vitals Value Taken Time  BP    Temp    Pulse    Resp    SpO2      Last Pain:  Vitals:   02/22/22 1110  TempSrc:   PainSc: 0-No pain         Complications: No notable events documented.

## 2022-02-22 NOTE — H&P (Signed)
@LOGO @   Primary Care Physician:  , MD Primary Gastroenterologist:  Dr. Glori Bickers  Pre-Procedure History & Physical: HPI:  Thomas Simon is a 76 y.o. male here for  further evaluation of chronic nonbloody watery diarrhea.  Past Medical History:  Diagnosis Date   Anemia    Coronary arteriosclerosis    GERD (gastroesophageal reflux disease)    Hyperlipidemia    Hypertension    Persistent atrial fibrillation (HCC)    PVD (peripheral vascular disease) (HCC)    Thyroid disease     Past Surgical History:  Procedure Laterality Date   ATRIAL FIBRILLATION ABLATION N/A 06/15/2020   Procedure: ATRIAL FIBRILLATION ABLATION;  Surgeon: 06/17/2020, MD;  Location: [facility] INVASIVE CV LAB;  Service: Cardiovascular;  Laterality: N/A;   CARDIOVERSION N/A 04/30/2020   Procedure: CARDIOVERSION;  Surgeon: 05/02/2020, MD;  Location: [facility] ENDOSCOPY;  Service: Cardiovascular;  Laterality: N/A;   CARDIOVERSION N/A 06/23/2020   Procedure: CARDIOVERSION;  Surgeon: 06/25/2020, MD;  Location: [facility] ENDOSCOPY;  Service: Cardiovascular;  Laterality: N/A;   CHOLECYSTECTOMY     KNEE ARTHROSCOPY W/ PARTIAL MEDIAL MENISCECTOMY     open heart surgery     3vessels   ROTATOR CUFF REPAIR      Prior to Admission medications   Medication Sig Start Date End Date Taking? Authorizing Provider  apixaban (ELIQUIS) 5 MG TABS tablet Take 5 mg by mouth 2 (two) times daily.   Yes [provider]  CALCIUM-MAGNESIUM-ZINC PO Take 3 tablets by mouth daily.   Yes [provider]  Cholecalciferol (VITAMIN D3) 50 MCG (2000 UT) TABS Take 2,000 Units by mouth daily.   Yes [provider]  cholestyramine light (PREVALITE) 4 g packet Take 1 packet by mouth 2 (two) times daily. 12/15/21  Yes [provider]  doxazosin (CARDURA) 4 MG tablet Take 4 mg by mouth at bedtime.   Yes [provider]  ferrous sulfate 325 (65 FE) MG tablet Take 325 mg by mouth 2 (two) times daily  with a meal.   Yes [provider]  gabapentin (NEURONTIN) 300 MG capsule Take 300 mg by mouth 2 (two) times daily.   Yes [provider]  12/17/21 Omega-3 500 MG CAPS Take 500 mg by mouth daily.   Yes [provider]  levothyroxine (SYNTHROID) 25 MCG tablet Take 25 mcg by mouth daily before breakfast.   Yes [provider]  metoprolol succinate (TOPROL XL) 25 MG 24 hr tablet Take 1 tablet (25 mg total) by mouth daily. 09/13/20  Yes Allred, 09/15/20, MD  Multiple Vitamins-Minerals (MULTIVITAMIN WITH MINERALS) tablet Take 1 tablet by mouth daily.   Yes [provider]  pantoprazole (PROTONIX) 40 MG tablet Take 40 mg by mouth 2 (two) times daily.   Yes [provider]  sildenafil (REVATIO) 20 MG tablet Take 60 mg by mouth daily as needed (ED). 09/23/21  Yes [provider]  simvastatin (ZOCOR) 20 MG tablet Take 20 mg by mouth at bedtime.   Yes [provider]  vitamin B-12 (CYANOCOBALAMIN) 1000 MCG tablet Take 1,000 mcg by mouth daily.   Yes [provider]  nitroGLYCERIN (NITROSTAT) 0.4 MG SL tablet Place 1 tablet (0.4 mg total) under the tongue every 5 (five) minutes as needed for chest pain (for chest pain.). first sign of chest pain or tightness place one tablet under your tongue. Let the tablet dissolve under the tongue. Do not swallow whole.  Do not eat or drink, smoke or chew  tobacco while a tablet is dissolving. If you are not better within 5 minutes after taking ONE dose of nitroglycerin, you may take a SECOND dose. IF YOU ARE GETTING TO THE 3RD DOSE CALL 911. 03/29/21   Hillis Range, MD    Allergies as of 02/15/2022 - Review Complete 02/01/2022  Allergen Reaction Noted   Celecoxib Shortness Of Breath 04/16/2020   Fentanyl  04/16/2020   Hydrocodone Other (See Comments) 04/16/2020   Tramadol Other (See Comments) 04/16/2020    Family History  Problem Relation Age of Onset   Heart failure Mother    Lung disease  Father    Colon cancer Neg Hx     Social History   Socioeconomic History   Marital status: Married    Spouse name: Not on file   Number of children: Not on file   Years of education: Not on file   Highest education level: Not on file  Occupational History   Not on file  Tobacco Use   Smoking status: Former   Smokeless tobacco: Current    Types: Chew  Vaping Use   Vaping Use: Unknown  Substance and Sexual Activity   Alcohol use: Never   Drug use: Never   Sexual activity: Yes  Other Topics Concern   Not on file  Social History Narrative   Not on file   Social Determinants of Health   Financial Resource Strain: Not on file  Food Insecurity: Not on file  Transportation Needs: Not on file  Physical Activity: Not on file  Stress: Not on file  Social Connections: Not on file  Intimate Partner Violence: Not on file    Review of Systems: See HPI, otherwise negative ROS  Physical Exam: BP 120/64   Pulse 68   Temp 98.3 F (36.8 C) (Oral)   Resp 18   SpO2 97%  General:   Alert,  Well-developed, well-nourished, pleasant and cooperative in NAD No significant cervical adenopathy. Lungs:  Clear throughout to auscultation.   No wheezes, crackles, or rhonchi. No acute distress. Heart:  Regular rate and rhythm; no murmurs, clicks, rubs,  or gallops. Abdomen: Non-distended, normal bowel sounds.  Soft and nontender without appreciable mass or hepatosplenomegaly.  Pulses:  Normal pulses noted. Extremities:  Without clubbing or edema.  Impression/Plan:    76 year old gentleman here with a 52-month history of new onset watery diarrhea.  Here for colonoscopy to further evaluate.  Need to consider microscopic colitis.  An infectious etiology is not excluded.   Recommendations I have offered him a diagnostic ileocolonoscopy today.  Stool sample and segmental biopsies likely   To be obtained.. The risks, benefits, limitations, alternatives and imponderables have been reviewed with the  patient. Questions have been answered. All parties are agreeable.       Notice: This dictation was prepared with Dragon dictation along with smaller phrase technology. Any transcriptional errors that result from this process are unintentional and may not be corrected upon review.

## 2022-02-22 NOTE — Anesthesia Preprocedure Evaluation (Signed)
Anesthesia Evaluation  Patient identified by MRN, date of birth, ID band Patient awake    Reviewed: Allergy & Precautions, H&P , NPO status , Patient's Chart, lab work & pertinent test results, reviewed documented beta blocker date and time   Airway Mallampati: II  TM Distance: >3 FB Neck ROM: full    Dental no notable dental hx.    Pulmonary neg pulmonary ROS, former smoker   Pulmonary exam normal breath sounds clear to auscultation       Cardiovascular Exercise Tolerance: Good hypertension, + CAD and + Peripheral Vascular Disease  + dysrhythmias Atrial Fibrillation  Rhythm:regular Rate:Normal     Neuro/Psych negative neurological ROS  negative psych ROS   GI/Hepatic Neg liver ROS,GERD  Medicated,,  Endo/Other  negative endocrine ROS    Renal/GU negative Renal ROS  negative genitourinary   Musculoskeletal   Abdominal   Peds  Hematology  (+) Blood dyscrasia, anemia   Anesthesia Other Findings   Reproductive/Obstetrics negative OB ROS                             Anesthesia Physical Anesthesia Plan  ASA: 3  Anesthesia Plan: General   Post-op Pain Management:    Induction:   PONV Risk Score and Plan: Propofol infusion  Airway Management Planned:   Additional Equipment:   Intra-op Plan:   Post-operative Plan:   Informed Consent: I have reviewed the patients History and Physical, chart, labs and discussed the procedure including the risks, benefits and alternatives for the proposed anesthesia with the patient or authorized representative who has indicated his/her understanding and acceptance.     Dental Advisory Given  Plan Discussed with: CRNA  Anesthesia Plan Comments:        Anesthesia Quick Evaluation

## 2022-02-22 NOTE — Op Note (Signed)
[facility] Patient Name: Thomas Simon Procedure Date: 02/22/2022 10:46 AM MRN: [id number] Date of Birth: [birth year] Attending MD: Norvel Richards , MD, [id number] CSN: [id number] Age: 76 Admit Type: Outpatient Procedure:                Colonoscopy Indications:              Chronic diarrhea Providers:                Norvel Richards, MD, Charlsie Quest. Theda Sers RN, RN,                            Wynonia Musty Tech, Technician Referring MD:              Medicines:                Propofol per Anesthesia Complications:            No immediate complications. Estimated Blood Loss:     Estimated blood loss was minimal. Procedure:                Pre-Anesthesia Assessment:                           - Prior to the procedure, a History and Physical                            was performed, and patient medications and                            allergies were reviewed. The patient's tolerance of                            previous anesthesia was also reviewed. The risks                            and benefits of the procedure and the sedation                            options and risks were discussed with the patient.                            All questions were answered, and informed consent                            was obtained. Prior Anticoagulants: The patient has                            taken no anticoagulant or antiplatelet agents. ASA                            Grade Assessment: III - A patient with severe                            systemic disease. After reviewing the risks and  benefits, the patient was deemed in satisfactory                            condition to undergo the procedure.                           After obtaining informed consent, the colonoscope                            was passed under direct vision. Throughout the                            procedure, the patient's blood pressure, pulse, and                             oxygen saturations were monitored continuously. The                            [id number] scope was introduced through the                            anus and advanced to the the cecum, identified by                            appendiceal orifice and ileocecal valve. The                            colonoscopy was performed without difficulty. The                            patient tolerated the procedure well. The entire                            colon was well visualized. Scope In: 11:22:21 AM Scope Out: 11:38:51 AM Scope Withdrawal Time: 0 hours 11 minutes 27 seconds  Total Procedure Duration: 0 hours 16 minutes 30 seconds  Findings:      The perianal and digital rectal examinations were normal.      Many medium-mouthed diverticula were found in the entire colon. I had to       I attempted to intubate the terminal ileum. Due to acute angulation I       was unable to do so. Sigmoid biopsies of the right and left colon taken       for histologic study. Also, stool sample submitted to the microbiology       lab      The exam was otherwise without abnormality on direct and retroflexion       views. Impression:               - Diverticulosis in the entire examined colon.                            Status post segmental biopsy and stool sampling                           - The examination  was otherwise normal on direct                            and retroflexion views. Moderate Sedation:      Moderate (conscious) sedation was personally administered by an       anesthesia professional. The following parameters were monitored: oxygen       saturation, heart rate, blood pressure, respiratory rate, EKG, adequacy       of pulmonary ventilation, and response to care. Recommendation:           - Patient has a contact number available for                            emergencies. The signs and symptoms of potential                            delayed complications were discussed with the                             patient. Return to normal activities tomorrow.                            Written discharge instructions were provided to the                            patient.                           - Advance diet as tolerated.                           - Continue present medications.                           - No repeat colonoscopy. Follow-up on pending labs.                           - Return to GI clinic (date not yet determined). Procedure Code(s):        --- Professional ---                           [phone], Colonoscopy, flexible; diagnostic, including                            collection of specimen(s) by brushing or washing,                            when performed (separate procedure) Diagnosis Code(s):        --- Professional ---                           K52.9, Noninfective gastroenteritis and colitis,                            unspecified  K57.30, Diverticulosis of large intestine without                            perforation or abscess without bleeding CPT copyright 2022 American Medical Association. All rights reserved. The codes documented in this report are preliminary and upon coder review may  be revised to meet current compliance requirements. Gerrit Friends. Sharyn Brilliant, MD Gennette Pac, MD 02/22/2022 11:47:46 AM This report has been signed electronically. Number of Addenda: 0

## 2022-02-22 NOTE — Discharge Instructions (Signed)
  Colonoscopy Discharge Instructions  Read the instructions outlined below and refer to this sheet in the next few weeks. These discharge instructions provide you with general information on caring for yourself after you leave the hospital. Your doctor may also give you specific instructions. While your treatment has been planned according to the most current medical practices available, unavoidable complications occasionally occur. If you have any problems or questions after discharge, call Dr. Jena Gauss at [phone]. ACTIVITY You may resume your regular activity, but move at a slower pace for the next 24 hours.  Take frequent rest periods for the next 24 hours.  Walking will help get rid of the air and reduce the bloated feeling in your belly (abdomen).  No driving for 24 hours (because of the medicine (anesthesia) used during the test).   Do not sign any important legal documents or operate any machinery for 24 hours (because of the anesthesia used during the test).  NUTRITION Drink plenty of fluids.  You may resume your normal diet as instructed by your doctor.  Begin with a light meal and progress to your normal diet. Heavy or fried foods are harder to digest and may make you feel sick to your stomach (nauseated).  Avoid alcoholic beverages for 24 hours or as instructed.  MEDICATIONS You may resume your normal medications unless your doctor tells you otherwise.  WHAT YOU CAN EXPECT TODAY Some feelings of bloating in the abdomen.  Passage of more gas than usual.  Spotting of blood in your stool or on the toilet paper.  IF YOU HAD POLYPS REMOVED DURING THE COLONOSCOPY: No aspirin products for 7 days or as instructed.  No alcohol for 7 days or as instructed.  Eat a soft diet for the next 24 hours.  FINDING OUT THE RESULTS OF YOUR TEST Not all test results are available during your visit. If your test results are not back during the visit, make an appointment with your caregiver to find out the  results. Do not assume everything is normal if you have not heard from your caregiver or the medical facility. It is important for you to follow up on all of your test results.  SEEK IMMEDIATE MEDICAL ATTENTION IF: You have more than a spotting of blood in your stool.  Your belly is swollen (abdominal distention).  You are nauseated or vomiting.  You have a temperature over 101.  You have abdominal pain or discomfort that is severe or gets worse throughout the day.     diverticulosis information provided.  Stool samples and biopsies taken today to further evaluate the cause of your diarrhea.  Further recommendations to follow pending review of pathology report  Resume Eliquis today  At patient request, I called Elease Hashimoto at [phone]-reviewed findings and recommendations

## 2022-02-23 LAB — GASTROINTESTINAL PANEL BY PCR, STOOL (REPLACES STOOL CULTURE)

## 2022-02-24 NOTE — Anesthesia Postprocedure Evaluation (Signed)
Anesthesia Post Note  Patient: Thomas Simon  Procedure(s) Performed: COLONOSCOPY WITH PROPOFOL BIOPSY  Patient location during evaluation: Phase II Anesthesia Type: General Level of consciousness: awake Pain management: pain level controlled Vital Signs Assessment: post-procedure vital signs reviewed and stable Respiratory status: spontaneous breathing and respiratory function stable Cardiovascular status: blood pressure returned to baseline and stable Postop Assessment: no headache and no apparent nausea or vomiting Anesthetic complications: no Comments: Late entry   No notable events documented.   Last Vitals:  Vitals:   02/22/22 1020 02/22/22 1143  BP: 120/64 (!) 100/51  Pulse: 68 64  Resp: 18 18  Temp: 36.8 C (!) 36.2 C  SpO2: 97% 97%    Last Pain:  Vitals:   02/22/22 1143  TempSrc: Axillary  PainSc: 0-No pain                 Windell Norfolk

## 2022-02-27 LAB — SURGICAL PATHOLOGY

## 2022-02-28 ENCOUNTER — Encounter: Payer: Self-pay | Admitting: Internal Medicine

## 2022-03-02 ENCOUNTER — Encounter (HOSPITAL_COMMUNITY): Payer: Self-pay | Admitting: Internal Medicine

## 2022-03-29 ENCOUNTER — Ambulatory Visit: Payer: Medicare Other | Admitting: Cardiovascular Disease

## 2022-04-11 ENCOUNTER — Encounter: Payer: Self-pay | Admitting: Gastroenterology

## 2022-04-11 ENCOUNTER — Ambulatory Visit (INDEPENDENT_AMBULATORY_CARE_PROVIDER_SITE_OTHER): Payer: Medicare Other | Admitting: Gastroenterology

## 2022-04-11 VITALS — BP 114/69 | HR 69 | Temp 97.4°F | Ht 67.0 in | Wt 150.8 lb

## 2022-04-11 DIAGNOSIS — R197 Diarrhea, unspecified: Secondary | ICD-10-CM | POA: Diagnosis not present

## 2022-04-11 MED ORDER — COLESTIPOL HCL 1 G PO TABS: 4.0000 g | ORAL_TABLET | Freq: Every day | ORAL | 5 refills | Status: DC

## 2022-04-11 NOTE — Patient Instructions (Signed)
Take cholestyramine 4 grams once daily (do not take within 3 hours of other medications). Once you run out, you can start colestid 4 grams (4 capsules) once daily (do not take within 3 hours of other medications).  Keep a stool journal, document how many stools daily. Keep at least 3 weeks. Let me know if no improvement in your symptoms.  Return office visit in 3 months.  I will request copy of your last CT scan done in [place].

## 2022-04-11 NOTE — Progress Notes (Signed)
GI Office Note    Referring Provider: Glori Bickers, MD Primary Care Physician:  Glori Bickers, MD  Primary Gastroenterologist: Roetta Sessions, MD   Chief Complaint   Chief Complaint  Patient presents with   Follow-up    Still having issues with diarrhea about twice weekly.    History of Present Illness   Thomas Simon is a 77 y.o. male presenting today for follow up of intermittent diarrhea. Last seen in office in 02/2022.   Baseline 2-3 stools per day. At time of last ov, 7-8 stools per day. Stopping metformin did not help. Noted some improvement with cholestyramine. GI profile negative. Colonoscopy completed in 02/2022, he had diverticulosis, random colon biopsies were negative. He was advised to continue cholestyramine 4mg  bid.   He presents today stating he continues to have at least twice per week with episodes of frequent stools 6-7 times per day, stool soft to loose. He typically takes cholestyramine only on days he has frequent stools. On good days, he still has several stools but they are formed. No melena,brbpr. No abdominal pain. No UGI symptoms.   He inquires about abdominal "rupture". States he has been told he had hernia in mid upper abdomen by an ED provider but never seen on several imaging studies over the years. Wife states he has had this evaluated two separate times. Last CT I can access was from 2018, noted to have fluid-filled distention of several bowel loops within LUQ similar to before. No mention of hernia.   Medications   Current Outpatient Medications  Medication Sig Dispense Refill   apixaban (ELIQUIS) 5 MG TABS tablet Take 5 mg by mouth 2 (two) times daily.     Cholecalciferol (VITAMIN D3) 50 MCG (2000 UT) TABS Take 2,000 Units by mouth daily.     cholestyramine light (PREVALITE) 4 g packet Take 1 packet by mouth 2 (two) times daily.     ciprofloxacin (CIPRO) 250 MG tablet Take 250 mg by mouth 2 (two) times daily.     doxazosin (CARDURA) 4  MG tablet Take 4 mg by mouth at bedtime.     ferrous sulfate 325 (65 FE) MG tablet Take 325 mg by mouth 2 (two) times daily with a meal.     gabapentin (NEURONTIN) 300 MG capsule Take 300 mg by mouth 2 (two) times daily.     Krill Oil Omega-3 500 MG CAPS Take 500 mg by mouth daily.     levothyroxine (SYNTHROID) 25 MCG tablet Take 25 mcg by mouth daily before breakfast.     magnesium oxide (MAG-OX) 400 MG tablet Take 1 tablet by mouth 2 (two) times daily.     metoprolol succinate (TOPROL XL) 25 MG 24 hr tablet Take 1 tablet (25 mg total) by mouth daily. 90 tablet 3   Multiple Vitamins-Minerals (MULTIVITAMIN WITH MINERALS) tablet Take 1 tablet by mouth daily.     nitroGLYCERIN (NITROSTAT) 0.4 MG SL tablet Place 1 tablet (0.4 mg total) under the tongue every 5 (five) minutes as needed for chest pain (for chest pain.). first sign of chest pain or tightness place one tablet under your tongue. Let the tablet dissolve under the tongue. Do not swallow whole.  Do not eat or drink, smoke or chew tobacco while a tablet is dissolving. If you are not better within 5 minutes after taking ONE dose of nitroglycerin, you may take a SECOND dose. IF YOU ARE GETTING TO THE 3RD DOSE CALL 911. 25 tablet 3   pantoprazole (  PROTONIX) 40 MG tablet Take 40 mg by mouth 2 (two) times daily.     sildenafil (REVATIO) 20 MG tablet Take 60 mg by mouth daily as needed (ED).     simvastatin (ZOCOR) 20 MG tablet Take 20 mg by mouth at bedtime.     vitamin B-12 (CYANOCOBALAMIN) 1000 MCG tablet Take 1,000 mcg by mouth daily.     No current facility-administered medications for this visit.    Allergies   Allergies as of 04/11/2022 - Review Complete 04/11/2022  Allergen Reaction Noted   Celecoxib Shortness Of Breath 04/16/2020   Hydrocodone Other (See Comments) 04/16/2020   Tramadol Other (See Comments) 04/16/2020   Fentanyl Rash 04/16/2020       Review of Systems   General: Negative for anorexia, weight loss, fever, chills,  fatigue, weakness. ENT: Negative for hoarseness, difficulty swallowing , nasal congestion. CV: Negative for chest pain, angina, palpitations, dyspnea on exertion, peripheral edema.  Respiratory: Negative for dyspnea at rest, dyspnea on exertion, cough, sputum, wheezing.  GI: See history of present illness. GU:  Negative for dysuria, hematuria, urinary incontinence, urinary frequency, nocturnal urination.  Endo: Negative for unusual weight change.     Physical Exam   BP 114/69 (BP Location: Right Arm, Patient Position: Sitting, Cuff Size: Normal)   Pulse 69   Temp (!) 97.4 F (36.3 C) (Oral)   Ht 5\' 7"  (1.702 m)   Wt 150 lb 12.8 oz (68.4 kg)   SpO2 97%   BMI 23.62 kg/m    General: Well-nourished, well-developed in no acute distress.  Eyes: No icterus. Mouth: Oropharyngeal mucosa moist and pink   Abdomen: Bowel sounds are normal, nontender, nondistended, no hepatosplenomegaly or masses,  no abdominal bruits or hernia , no rebound or guarding. Rectus diastasis Rectal: not performed Extremities: No lower extremity edema. No clubbing or deformities. Neuro: Alert and oriented x 4   Skin: Warm and dry, no jaundice.   Psych: Alert and cooperative, normal mood and affect.  Labs   Lab Results  Component Value Date   CREATININE 0.79 11/15/2021   BUN 19 11/15/2021   NA 139 11/15/2021   K 4.4 11/15/2021   CL 104 11/15/2021   CO2 26 11/15/2021   Lab Results  Component Value Date   WBC 11.6 (H) 11/15/2021   HGB 14.9 11/15/2021   HCT 44.3 11/15/2021   MCV 98.4 11/15/2021   PLT 134 (L) 11/15/2021    Imaging Studies   No results found.  Assessment   Diarrhea: responds to cholestyramine but he does not like texture/taste and therefore only taking intermittently on "bad days". Will try switching him over to colestid.    PLAN   Take cholestyramine 4grams once daily until runs out of current RX. Then he can switch to colestid 4 grams daily (he does not like taste/texture of  powder).  Keep stool journal. If no improvement in bowel function he will let me know. Request copy of last CT done in [place]. Return office visit in 3 months.    Laureen Ochs. Bobby Rumpf, [place], [facility]

## 2022-04-13 ENCOUNTER — Encounter: Payer: Self-pay | Admitting: Cardiovascular Disease

## 2022-04-13 ENCOUNTER — Ambulatory Visit: Payer: Medicare Other | Attending: Cardiovascular Disease | Admitting: Cardiovascular Disease

## 2022-04-13 VITALS — BP 140/64 | HR 59 | Ht 67.0 in | Wt 152.0 lb

## 2022-04-13 DIAGNOSIS — I4819 Other persistent atrial fibrillation: Secondary | ICD-10-CM | POA: Diagnosis present

## 2022-04-13 NOTE — Patient Instructions (Signed)
Medication Instructions:  Your physician recommends that you continue on your current medications as directed. Please refer to the Current Medication list given to you today.  *If you need a refill on your cardiac medications before your next appointment, please call your pharmacy*  Lab Work: NONE  Testing/Procedures: NONE  Follow-Up: At [facility], you and your health needs are our priority.  As part of our continuing mission to provide you with exceptional heart care, we have created designated Provider Care Teams.  These Care Teams include your primary Cardiologist (physician) and Advanced Practice Providers (APPs -  Physician Assistants and Nurse Practitioners) who all work together to provide you with the care you need, when you need it.  Your next appointment:   6 month(s) in [facility] 1 year with Dr. Myles Gip  Provider:   You will follow up in the [facility] located at [facility]. Your provider will be: Roderic Palau, NP or Clint R. Fenton, PA-C  In 1 year Doralee Albino, MD

## 2022-04-13 NOTE — Progress Notes (Signed)
PCP: Thomas Police, MD Primary Cardiologist: Thomas Thomas Simon Primary EP: Thomas Thomas Simon  Thomas Simon is a 77 y.o. male who presents today for routine electrophysiology followup.  Since last being seen in our clinic, the patient reports doing very well.  Today, he denies symptoms of palpitations, chest pain, shortness of breath,  lower extremity edema, dizziness, presyncope, or syncope.  The patient is otherwise without complaint today.   Since his last visit in A-fib clinic in June, he has discontinued the amiodarone.  He is under the impression that Thomas. Rayann Simon directed him to stop it.  He reports that he is doing very well and has not had any symptoms to suggest recurrence of atrial fibrillation.  Past Medical History:  Diagnosis Date   Anemia    Coronary arteriosclerosis    GERD (gastroesophageal reflux disease)    Hyperlipidemia    Hypertension    Persistent atrial fibrillation (HCC)    PVD (peripheral vascular disease) ([place])    Thyroid disease    Past Surgical History:  Procedure Laterality Date   ATRIAL FIBRILLATION ABLATION N/A 06/15/2020   Procedure: ATRIAL FIBRILLATION ABLATION;  Surgeon: Thomas Grayer, MD;  Location: [facility];  Service: Cardiovascular;  Laterality: N/A;   BIOPSY  02/22/2022   Procedure: BIOPSY;  Surgeon: Thomas Dolin, MD;  [facility];  Service: Endoscopy;;   CARDIOVERSION N/A 04/30/2020   Procedure: CARDIOVERSION;  Surgeon: Thomas Latch, MD;  Location: [facility];  Service: Cardiovascular;  Laterality: N/A;   CARDIOVERSION N/A 06/23/2020   Procedure: CARDIOVERSION;  Surgeon: Thomas Headings, MD;  Location[facility]COPY;  Service: Cardiovascular;  Laterality: N/A;   CHOLECYSTECTOMY     COLONOSCOPY WITH PROPOFOL N/A 02/22/2022   Procedure: COLONOSCOPY WITH PROPOFOL;  Surgeon: Thomas Dolin, MD;  Location[facility];  Service: Endoscopy;  Laterality: N/A;  12:15pm, asa 3, MB is full   KNEE ARTHROSCOPY W/ PARTIAL MEDIAL  MENISCECTOMY     open heart surgery     3vessels   ROTATOR CUFF REPAIR      ROS- all systems are reviewed and negatives except as per HPI above  Current Outpatient Medications  Medication Sig Dispense Refill   apixaban (ELIQUIS) 5 MG TABS tablet Take 5 mg by mouth 2 (two) times daily.     Cholecalciferol (VITAMIN D3) 50 MCG (2000 UT) TABS Take 2,000 Units by mouth daily.     cholestyramine light (PREVALITE) 4 g packet Take 1 packet by mouth 2 (two) times daily.     ciprofloxacin (CIPRO) 250 MG tablet Take 250 mg by mouth 2 (two) times daily.     colestipol (COLESTID) 1 g tablet Take 4 tablets (4 g total) by mouth daily. Separate from other medications by at least 3 hours. Start after you run out of cholestyramine powder. 120 tablet 5   doxazosin (CARDURA) 4 MG tablet Take 4 mg by mouth at bedtime.     ferrous sulfate 325 (65 FE) MG tablet Take 325 mg by mouth 2 (two) times daily with a meal.     gabapentin (NEURONTIN) 300 MG capsule Take 300 mg by mouth 2 (two) times daily.     Krill Oil Omega-3 500 MG CAPS Take 500 mg by mouth daily.     levothyroxine (SYNTHROID) 25 MCG tablet Take 25 mcg by mouth daily before breakfast.     magnesium oxide (MAG-OX) 400 MG tablet Take 1 tablet by mouth 2 (two) times daily.     metoprolol succinate (  TOPROL XL) 25 MG 24 hr tablet Take 1 tablet (25 mg total) by mouth daily. 90 tablet 3   Multiple Vitamins-Minerals (MULTIVITAMIN WITH MINERALS) tablet Take 1 tablet by mouth daily.     nitroGLYCERIN (NITROSTAT) 0.4 MG SL tablet Place 1 tablet (0.4 mg total) under the tongue every 5 (five) minutes as needed for chest pain (for chest pain.). first sign of chest pain or tightness place one tablet under your tongue. Let the tablet dissolve under the tongue. Do not swallow whole.  Do not eat or drink, smoke or chew tobacco while a tablet is dissolving. If you are not better within 5 minutes after taking ONE dose of nitroglycerin, you may take a SECOND dose. IF YOU ARE  GETTING TO THE 3RD DOSE CALL 911. 25 tablet 3   pantoprazole (PROTONIX) 40 MG tablet Take 40 mg by mouth 2 (two) times daily.     sildenafil (REVATIO) 20 MG tablet Take 60 mg by mouth daily as needed (ED).     simvastatin (ZOCOR) 20 MG tablet Take 20 mg by mouth at bedtime.     vitamin B-12 (CYANOCOBALAMIN) 1000 MCG tablet Take 1,000 mcg by mouth daily.     No current facility-administered medications for this visit.    Physical Exam: Vitals:   04/13/22 1511  BP: (!) 140/64  Pulse: (!) 59  SpO2: 97%  Weight: 152 lb (68.9 kg)  Height: 5\' 7"  (1.702 m)    GEN- The patient is well appearing, alert and oriented x 3 today.   Head- normocephalic, atraumatic Eyes-  Sclera clear, conjunctiva pink Ears- hearing intact Oropharynx- clear Lungs- Clear to ausculation bilaterally, normal work of breathing Heart- Regular rate and rhythm, no murmurs, rubs or gallops, PMI not laterally displaced GI- soft, NT, ND, + BS Extremities- no clubbing, cyanosis, or edema  Wt Readings from Last 3 Encounters:  04/13/22 152 lb (68.9 kg)  04/11/22 150 lb 12.8 oz (68.4 kg)  02/20/22 149 lb (67.6 kg)    EKG tracing ordered today is personally reviewed and shows sinus  Assessment and Plan:  Persistent afib Doing well post ablation, now off of amiodarone.  He has not had any symptoms to suggest recurrence and would really like to discontinue anticoagulation.  I think would be reasonable but only if we are monitoring for recurrence with a loop monitor.  He does not want to have a device implanted at this time.   Chads2vasc score is 5.  He is on eliquis   2. CAD s/p CABG No ischemic symptoms No changes He follows with Thomas Simon in [place]  3. HTN Stable No change required today Bmet today  Risks, benefits and potential toxicities for medications prescribed and/or refilled reviewed with patient today.   Return to AF clinic in 6 months I will see in a year  Thomas Simon  04/13/2022 3:22 PM

## 2022-04-21 NOTE — Progress Notes (Signed)
CT A/P with contrast 10/2021: scanned under media. No mention of abdominal hernia. No significant findings.

## 2022-05-11 ENCOUNTER — Encounter (HOSPITAL_COMMUNITY): Payer: Self-pay | Admitting: *Deleted

## 2022-06-08 ENCOUNTER — Encounter: Payer: Self-pay | Admitting: Internal Medicine

## 2022-07-18 ENCOUNTER — Ambulatory Visit: Payer: Medicare Other | Admitting: Gastroenterology

## 2022-08-01 ENCOUNTER — Encounter: Payer: Self-pay | Admitting: Internal Medicine

## 2022-08-01 ENCOUNTER — Ambulatory Visit (INDEPENDENT_AMBULATORY_CARE_PROVIDER_SITE_OTHER): Payer: Medicare Other | Admitting: Internal Medicine

## 2022-08-01 VITALS — BP 144/63 | HR 54 | Temp 97.7°F | Ht 67.0 in | Wt 147.6 lb

## 2022-08-01 DIAGNOSIS — R197 Diarrhea, unspecified: Secondary | ICD-10-CM

## 2022-08-01 NOTE — Patient Instructions (Signed)
It was good to see you again today!  At this time, you can drop back on your Protonix or pantoprazole to 40 mg once daily 30 minutes before breakfast  It sounds like Colestid has helped you quite a bit.  Continue that medication for the time being  Will check a be met and serum magnesium today.  If your magnesium is normal, we will stop the magnesium supplement entirely.  Office visit here in 3 months  Further recommendations to follow in the near future.

## 2022-08-01 NOTE — Progress Notes (Signed)
Primary Care Physician:  Glori Bickers, MD Primary Gastroenterologist:  Dr. Jena Gauss  Pre-Procedure History & Physical: HPI:  Thomas Simon is a 77 y.o. male here for follow-up chronic diarrhea.  Colestid has helped diarrhea symptoms significantly.  Does not feel bowel function is back to normal as it used to be years past but states he is pretty good at this time.  Prior workup as the cause of diarrhea negative GIP negative colonoscopy biopsies negative for microscopic colitis.  Does have a history of hypomagnesemia and was on magnesium oxide twice daily he cut back to once daily cannot tell a difference.  Chronically anticoagulated with Eliquis.  History of reflux he has been on Protonix 40 mg twice daily for some time as the reflux symptoms.  No dysphagia.  Past Medical History:  Diagnosis Date   Anemia    Coronary arteriosclerosis    GERD (gastroesophageal reflux disease)    Hyperlipidemia    Hypertension    Persistent atrial fibrillation (HCC)    PVD (peripheral vascular disease) (HCC)    Thyroid disease     Past Surgical History:  Procedure Laterality Date   ATRIAL FIBRILLATION ABLATION N/A 06/15/2020   Procedure: ATRIAL FIBRILLATION ABLATION;  Surgeon: Hillis Range, MD;  [facility]: [facility];  Service: Cardiovascular;  Laterality: N/A;   BIOPSY  02/22/2022   Procedure: BIOPSY;  Surgeon: Corbin Ade, MD;  [facility]: [facility];  Service: Endoscopy;;   CARDIOVERSION N/A 04/30/2020   Procedure: CARDIOVERSION;  Surgeon: Chilton Si, MD;  Location: [facility] ENDOSCOPY;  Service: Cardiovascular;  Laterality: N/A;   CARDIOVERSION N/A 06/23/2020   Procedure: CARDIOVERSION;  Surgeon: Vesta Mixer, MD;  Location: [facility] ENDOSCOPY;  Service: Cardiovascular;  Laterality: N/A;   CHOLECYSTECTOMY     COLONOSCOPY WITH PROPOFOL N/A 02/22/2022   Procedure: COLONOSCOPY WITH PROPOFOL;  Surgeon: Corbin Ade, MD;  Location: [facility];  Service: Endoscopy;  Laterality: N/A;   12:15pm, asa 3, MB is full   KNEE ARTHROSCOPY W/ PARTIAL MEDIAL MENISCECTOMY     open heart surgery     3vessels   ROTATOR CUFF REPAIR      Prior to Admission medications   Medication Sig Start Date End Date Taking? Authorizing Provider  apixaban (ELIQUIS) 5 MG TABS tablet Take 5 mg by mouth 2 (two) times daily.   Yes [provider]  Cholecalciferol (VITAMIN D3) 50 MCG (2000 UT) TABS Take 2,000 Units by mouth daily.   Yes [provider]  cholestyramine light (PREVALITE) 4 g packet Take 1 packet by mouth 2 (two) times daily. 12/15/21  Yes [provider]  colestipol (COLESTID) 1 g tablet Take 4 tablets (4 g total) by mouth daily. Separate from other medications by at least 3 hours. Start after you run out of cholestyramine powder. 04/11/22  Yes Tiffany Kocher, PA-C  doxazosin (CARDURA) 4 MG tablet Take 4 mg by mouth at bedtime.   Yes [provider]  ferrous sulfate 325 (65 FE) MG tablet Take 325 mg by mouth 2 (two) times daily with a meal.   Yes [provider]  gabapentin (NEURONTIN) 300 MG capsule Take 300 mg by mouth 2 (two) times daily.   Yes [provider]  Providence Lanius Omega-3 500 MG CAPS Take 500 mg by mouth daily.   Yes [provider]  levothyroxine (SYNTHROID) 25 MCG tablet Take 25 mcg by mouth daily before breakfast.   Yes [provider]  magnesium oxide (MAG-OX) 400  MG tablet Take 1 tablet by mouth 2 (two) times daily. 03/22/22  Yes [provider]  metoprolol succinate (TOPROL XL) 25 MG 24 hr tablet Take 1 tablet (25 mg total) by mouth daily. 09/13/20  Yes Allred, Fayrene Fearing, MD  Multiple Vitamins-Minerals (MULTIVITAMIN WITH MINERALS) tablet Take 1 tablet by mouth daily.   Yes [provider]  nitroGLYCERIN (NITROSTAT) 0.4 MG SL tablet Place 1 tablet (0.4 mg total) under the tongue every 5 (five) minutes as needed for chest pain (for chest pain.). first sign of chest pain or tightness place one tablet  under your tongue. Let the tablet dissolve under the tongue. Do not swallow whole.  Do not eat or drink, smoke or chew tobacco while a tablet is dissolving. If you are not better within 5 minutes after taking ONE dose of nitroglycerin, you may take a SECOND dose. IF YOU ARE GETTING TO THE 3RD DOSE CALL 911. 03/29/21  Yes Allred, Fayrene Fearing, MD  pantoprazole (PROTONIX) 40 MG tablet Take 40 mg by mouth 2 (two) times daily.   Yes [provider]  simvastatin (ZOCOR) 20 MG tablet Take 20 mg by mouth at bedtime.   Yes [provider]  terbinafine (LAMISIL) 250 MG tablet Take 250 mg by mouth daily. 07/10/22  Yes [provider]  vitamin B-12 (CYANOCOBALAMIN) 1000 MCG tablet Take 1,000 mcg by mouth daily.   Yes [provider]    Allergies as of 08/01/2022 - Review Complete 08/01/2022  Allergen Reaction Noted   Celecoxib Shortness Of Breath 04/16/2020   Hydrocodone Other (See Comments) 04/16/2020   Tramadol Other (See Comments) 04/16/2020   Fentanyl Rash 04/16/2020    Family History  Problem Relation Age of Onset   Heart failure Mother    Lung disease Father    Colon cancer Neg Hx     Social History   Socioeconomic History   Marital status: Married    Spouse name: Not on file   Number of children: Not on file   Years of education: Not on file   Highest education level: Not on file  Occupational History   Not on file  Tobacco Use   Smoking status: Former   Smokeless tobacco: Current    Types: Designer, multimedia Use   Vaping Use: Unknown  Substance and Sexual Activity   Alcohol use: Never   Drug use: Never   Sexual activity: Yes  Other Topics Concern   Not on file  Social History Narrative   Not on file   Social Determinants of Health   Financial Resource Strain: Not on file  Food Insecurity: Not on file  Transportation Needs: Not on file  Physical Activity: Not on file  Stress: Not on file  Social Connections: Not on file  Intimate Partner  Violence: Not on file    Review of Systems: See HPI, otherwise negative ROS  Physical Exam: BP (!) 150/69 (BP Location: Right Arm, Patient Position: Sitting, Cuff Size: Normal)   Pulse (!) 56   Temp 97.7 F (36.5 C) (Oral)   Ht 5\' 7"  (1.702 m)   Wt 147 lb 9.6 oz (67 kg)   SpO2 96%   BMI 23.12 kg/m  General:   Alert,  Well-developed, well-nourished, pleasant and cooperative in NAD Neck:  Supple; no masses or thyromegaly. No significant cervical adenopathy. Lungs:  Clear throughout to auscultation.   No wheezes, crackles, or rhonchi. No acute distress. Heart:  Regular rate and rhythm; no murmurs, clicks, rubs,  or gallops.  Abdomen: Non-distended, normal bowel sounds.  Soft and nontender without appreciable mass or hepatosplenomegaly.   Impression/Plan: Chronic diarrhea much better with Colestid.  GERD well-controlled on twice daily Protonix.  We may be able to de-escalate dosing.  Recommendations:  At this time, drop back on Protonix or pantoprazole to 40 mg once daily 30 minutes before breakfast  Continue Colestid daily.  Timing of dosing discussed.  Will check a be met and serum magnesium today.  If your magnesium is normal, we will stop the magnesium supplement entirely.  Office visit here in 3 months  Further recommendations to follow in the near future.       Notice: This dictation was prepared with Dragon dictation along with smaller phrase technology. Any transcriptional errors that result from this process are unintentional and may not be corrected upon review.

## 2022-08-02 LAB — MAGNESIUM: Magnesium: 1.6 mg/dL (ref 1.5–2.5)

## 2022-08-02 LAB — BASIC METABOLIC PANEL
BUN: 22 mg/dL (ref 7–25)
CO2: 30 mmol/L (ref 20–32)
Calcium: 8.6 mg/dL (ref 8.6–10.3)
Chloride: 103 mmol/L (ref 98–110)
Creat: 0.74 mg/dL (ref 0.70–1.28)
Glucose, Bld: 109 mg/dL — ABNORMAL HIGH (ref 65–99)
Potassium: 4.1 mmol/L (ref 3.5–5.3)
Sodium: 139 mmol/L (ref 135–146)

## 2022-09-06 ENCOUNTER — Encounter: Payer: Self-pay | Admitting: Internal Medicine

## 2022-10-12 ENCOUNTER — Encounter (HOSPITAL_COMMUNITY): Payer: Self-pay | Admitting: Physician Assistant

## 2022-10-12 ENCOUNTER — Ambulatory Visit (HOSPITAL_COMMUNITY)
Admission: RE | Admit: 2022-10-12 | Discharge: 2022-10-12 | Disposition: A | Discharge status: Home / Self Care | Payer: Medicare Other | Source: Ambulatory Visit | Attending: Physician Assistant | Admitting: Physician Assistant

## 2022-10-12 VITALS — BP 112/56 | HR 48 | Ht 67.0 in | Wt 144.4 lb

## 2022-10-12 DIAGNOSIS — I4819 Other persistent atrial fibrillation: Secondary | ICD-10-CM | POA: Insufficient documentation

## 2022-10-12 DIAGNOSIS — Z951 Presence of aortocoronary bypass graft: Secondary | ICD-10-CM | POA: Diagnosis not present

## 2022-10-12 DIAGNOSIS — Z79899 Other long term (current) drug therapy: Secondary | ICD-10-CM | POA: Insufficient documentation

## 2022-10-12 DIAGNOSIS — I2510 Atherosclerotic heart disease of native coronary artery without angina pectoris: Secondary | ICD-10-CM | POA: Insufficient documentation

## 2022-10-12 DIAGNOSIS — E039 Hypothyroidism, unspecified: Secondary | ICD-10-CM | POA: Diagnosis not present

## 2022-10-12 DIAGNOSIS — D6869 Other thrombophilia: Secondary | ICD-10-CM | POA: Insufficient documentation

## 2022-10-12 DIAGNOSIS — Z7901 Long term (current) use of anticoagulants: Secondary | ICD-10-CM | POA: Insufficient documentation

## 2022-10-12 DIAGNOSIS — E119 Type 2 diabetes mellitus without complications: Secondary | ICD-10-CM | POA: Insufficient documentation

## 2022-10-12 DIAGNOSIS — I4892 Unspecified atrial flutter: Secondary | ICD-10-CM | POA: Insufficient documentation

## 2022-10-12 DIAGNOSIS — I10 Essential (primary) hypertension: Secondary | ICD-10-CM | POA: Diagnosis not present

## 2022-10-12 NOTE — Progress Notes (Signed)
Primary Care Physician: Glori Bickers, MD Primary Cardiologist: Dr Koren Shiver Electrophysiologist: Maurice Small, MD  Referring Physician: Dr Johney Frame   Thomas Simon is a 77 y.o. male with a history of CAD s/p CABG, diabetes, HTN, hypothyroidism, atrial fibrillation who presents for follow up in the [facility].  The patient was initially diagnosed with atrial fibrillation 02/2020 after presenting with symptoms of fatigue and SOB. Patient is on Eliquis for a CHADS2VASC score of 5. He underwent DCCV on 04/30/20. Unfortunately, he was back in afib before even leaving the hospital. He does note that he has more fatigue and SOB while in afib. Patient is s/p afib ablation 06/15/20. Unfortunately, he was back in afib about two days post ablation with symptoms of fatigue and lightheadedness. Patient is s/p DCCV on 06/23/20. He only stayed in SR for about 2 days. He was started on amiodarone but this was later discontinued as he had no recurrence of afib.  On follow up today, patient reports that he has done well since his last visit. He has not had any symptoms of afib or episodes detected on his smart watch. No bleeding issues on anticoagulation.   Today, he denies symptoms of palpitations, chest pain, shortness of breath, orthopnea, PND, lower extremity edema, dizziness, presyncope, syncope, snoring, daytime somnolence, bleeding, or neurologic sequela. The patient is tolerating medications without difficulties and is otherwise without complaint today.    Atrial Fibrillation Risk Factors:  he does not have symptoms or diagnosis of sleep apnea. he does not have a history of rheumatic fever. he does not have a history of alcohol use.   Atrial Fibrillation Management history:  Previous antiarrhythmic drugs: amiodarone Previous cardioversions: 04/30/20, 06/23/20 Previous ablations: 06/15/20 Anticoagulation history: Eliquis  ROS- All systems are reviewed and negative  except as per the HPI above.   Physical Exam: BP (!) 112/56   Pulse (!) 48   Ht 5\' 7"  (1.702 m)   Wt 65.5 kg   BMI 22.62 kg/m   GEN: Well nourished, well developed in no acute distress NECK: No JVD; No carotid bruits CARDIAC: Regular rate and rhythm, no murmurs, rubs, gallops RESPIRATORY:  Clear to auscultation without rales, wheezing or rhonchi  ABDOMEN: Soft, non-tender, non-distended EXTREMITIES:  No edema; No deformity   Wt Readings from Last 3 Encounters:  10/12/22 65.5 kg  08/01/22 67 kg  04/13/22 68.9 kg     EKG today demonstrates  SB, blocked PAC Vent. rate 48 BPM PR interval 168 ms QRS duration 94 ms QT/QTcB 442/394 ms   CHA2DS2-VASc Score = 5  The patient's score is based upon: CHF History: 0 HTN History: 1 Diabetes History: 1 Stroke History: 0 Vascular Disease History: 1 Age Score: 2 Gender Score: 0       ASSESSMENT AND PLAN: Persistent Atrial Fibrillation/atrial flutter The patient's CHA2DS2-VASc score is 5, indicating a 7.2% annual risk of stroke.   S/p afib ablation 06/15/20  Patient appears to be maintaining SR Continue Eliquis 5 mg BID Continue Toprol 25 mg daily, patient asymptomatic with his bradycardia.   Secondary Hypercoagulable State (ICD10:  D68.69) The patient is at significant risk for stroke/thromboembolism based upon his CHA2DS2-VASc Score of 5.  Continue Apixaban (Eliquis).   CAD S/p CABG No anginal symptoms Followed by Dr Koren Shiver  HTN Stable on current regimen   Follow up with Dr Nelly Laurence in 6 months per recall.    [name] PA-C [facility] [address] [place],  [place] [address] [phone]

## 2022-10-24 ENCOUNTER — Ambulatory Visit (INDEPENDENT_AMBULATORY_CARE_PROVIDER_SITE_OTHER): Payer: Medicare Other | Admitting: Internal Medicine

## 2022-10-24 ENCOUNTER — Encounter: Payer: Self-pay | Admitting: Internal Medicine

## 2022-10-24 VITALS — BP 113/60 | HR 53 | Temp 98.6°F | Ht 67.0 in | Wt 146.4 lb

## 2022-10-24 DIAGNOSIS — R197 Diarrhea, unspecified: Secondary | ICD-10-CM

## 2022-10-24 DIAGNOSIS — R143 Flatulence: Secondary | ICD-10-CM

## 2022-10-24 NOTE — Progress Notes (Unsigned)
Primary Care Physician:  Glori Bickers, MD Primary Gastroenterologist:  Dr. Jena Gauss  Pre-Procedure History & Physical: HPI:  Thomas Simon is a 77 y.o. male here for further evaluation of chronic diarrhea diarrhea has improved but he still has not takes Colestid 2 tablets twice daily for control.  Has quite a bit of flatulence.  He said the symptoms for a year prior to this and no prior issues with diarrhea/bloating.  GIP negative.  Colon biopsies negative for microscopic colitis.  No C. difficile done.  Have not evaluated for pancreatic exocrine insufficiency as of yet.  Reflux well-controlled on twice daily pantoprazole; patient is cut back to once daily.  His weight is stable.  He does say overall his diarrhea has lessened but he is not back to his baseline.  Past Medical History:  Diagnosis Date   Anemia    Coronary arteriosclerosis    GERD (gastroesophageal reflux disease)    Hyperlipidemia    Hypertension    Persistent atrial fibrillation (HCC)    PVD (peripheral vascular disease) (HCC)    Thyroid disease     Past Surgical History:  Procedure Laterality Date   ATRIAL FIBRILLATION ABLATION N/A 06/15/2020   Procedure: ATRIAL FIBRILLATION ABLATION;  Surgeon: Hillis Range, MD;  Location: [facility];  Service: Cardiovascular;  Laterality: N/A;   BIOPSY  02/22/2022   Procedure: BIOPSY;  Surgeon: Corbin Ade, MD;  [facility];  Service: Endoscopy;;   CARDIOVERSION N/A 04/30/2020   Procedure: CARDIOVERSION;  Surgeon: Chilton Si, MD;  Location: [facility] ENDOSCOPY;  Service: Cardiovascular;  Laterality: N/A;   CARDIOVERSION N/A 06/23/2020   Procedure: CARDIOVERSION;  Surgeon: Vesta Mixer, MD;  Location: [facility] ENDOSCOPY;  Service: Cardiovascular;  Laterality: N/A;   CHOLECYSTECTOMY     COLONOSCOPY WITH PROPOFOL N/A 02/22/2022   Procedure: COLONOSCOPY WITH PROPOFOL;  Surgeon: Corbin Ade, MD;  [facility]: [facility];  Service: Endoscopy;  Laterality:  N/A;  12:15pm, asa 3, MB is full   KNEE ARTHROSCOPY W/ PARTIAL MEDIAL MENISCECTOMY     open heart surgery     3vessels   ROTATOR CUFF REPAIR      Prior to Admission medications   Medication Sig Start Date End Date Taking? Authorizing Provider  apixaban (ELIQUIS) 5 MG TABS tablet Take 5 mg by mouth 2 (two) times daily.   Yes [provider]  Cholecalciferol (VITAMIN D3) 50 MCG (2000 UT) TABS Take 2,000 Units by mouth daily.   Yes [provider]  colestipol (COLESTID) 1 g tablet Take 4 tablets (4 g total) by mouth daily. Separate from other medications by at least 3 hours. Start after you run out of cholestyramine powder. Patient taking differently: Take 2 g by mouth 2 (two) times daily. Separate from other medications by at least 3 hours. Start after you run out of cholestyramine powder. 04/11/22  Yes Tiffany Kocher, PA-C  doxazosin (CARDURA) 4 MG tablet Take 4 mg by mouth at bedtime.   Yes [provider]  ferrous sulfate 325 (65 FE) MG tablet Take 325 mg by mouth 2 (two) times daily with a meal.   Yes [provider]  gabapentin (NEURONTIN) 300 MG capsule Take 300 mg by mouth 2 (two) times daily.   Yes [provider]  Providence Lanius Omega-3 500 MG CAPS Take 500 mg by mouth daily.   Yes [provider]  levothyroxine (SYNTHROID) 25 MCG tablet Take 25 mcg by mouth daily before breakfast.  Yes [provider]  losartan (COZAAR) 25 MG tablet Take 75 mg by mouth daily.   Yes [provider]  losartan (COZAAR) 50 MG tablet Take 75 mg by mouth daily.   Yes [provider]  magnesium oxide (MAG-OX) 400 MG tablet Take 1 tablet by mouth every other day. 03/22/22  Yes [provider]  metoprolol succinate (TOPROL XL) 25 MG 24 hr tablet Take 1 tablet (25 mg total) by mouth daily. 09/13/20  Yes Allred, Fayrene Fearing, MD  Multiple Vitamins-Minerals (MULTIVITAMIN WITH MINERALS) tablet Take 1 tablet by mouth daily.   Yes [provider]  nitroGLYCERIN (NITROSTAT) 0.4 MG SL tablet Place 1 tablet (0.4 mg total) under the tongue every 5 (five) minutes as needed for chest pain (for chest pain.). first sign of chest pain or tightness place one tablet under your tongue. Let the tablet dissolve under the tongue. Do not swallow whole.  Do not eat or drink, smoke or chew tobacco while a tablet is dissolving. If you are not better within 5 minutes after taking ONE dose of nitroglycerin, you may take a SECOND dose. IF YOU ARE GETTING TO THE 3RD DOSE CALL 911. 03/29/21  Yes Allred, Fayrene Fearing, MD  pantoprazole (PROTONIX) 40 MG tablet Take 40 mg by mouth 2 (two) times daily.   Yes [provider]  simvastatin (ZOCOR) 20 MG tablet Take 20 mg by mouth at bedtime.   Yes [provider]  terbinafine (LAMISIL) 250 MG tablet Take 250 mg by mouth daily. 07/10/22  Yes [provider]  vitamin B-12 (CYANOCOBALAMIN) 1000 MCG tablet Take 1,000 mcg by mouth daily.   Yes [provider]  cholestyramine light (PREVALITE) 4 g packet Take 1 packet by mouth 2 (two) times daily. Patient not taking: Reported on 10/12/2022 12/15/21   [provider]    Allergies as of 10/24/2022 - Review Complete 10/24/2022  Allergen Reaction Noted   Celecoxib Shortness Of Breath 04/16/2020   Hydrocodone Other (See Comments) 04/16/2020   Tramadol Other (See Comments) 04/16/2020   Fentanyl Rash 04/16/2020    Family History  Problem Relation Age of Onset   Heart failure Mother    Lung disease Father    Colon cancer Neg Hx     Social History   Socioeconomic History   Marital status: Married    Spouse name: Not on file   Number of children: Not on file   Years of education: Not on file   Highest education level: Not on file  Occupational History   Not on file  Tobacco Use   Smoking status: Former   Smokeless tobacco: Current    Types: Chew  Vaping Use   Vaping status: Unknown  Substance and Sexual Activity    Alcohol use: Never   Drug use: Never   Sexual activity: Yes  Other Topics Concern   Not on file  Social History Narrative   Not on file   Social Determinants of Health   Financial Resource Strain: Not on file  Food Insecurity: Not on file  Transportation Needs: Not on file  Physical Activity: Not on file  Stress: Not on file  Social Connections: Not on file  Intimate Partner Violence: Not on file    Review of Systems: See HPI, otherwise negative ROS  Physical Exam: BP 113/60   Pulse (!) 53   Temp 98.6 F (37 C)   Ht 5\' 7"  (1.702 m)   Wt 146 lb 6.4 oz (66.4 kg)  BMI 22.93 kg/m  General:   Alert,  Well-developed, well-nourished, pleasant and cooperative in NAD; accompanied by his wife. Abdomen: Non-distended, normal bowel sounds.  Soft and nontender without appreciable mass or hepatosplenomegaly.   Impression/Plan: 72 old gentleman with watery diarrhea of nearly 1 year duration.  Improved with Colestid.  Not resolved.  No evidence of microscopic colitis on biopsies.  He is has quite a bit of flatulence now in association with his diarrhea.  May be a side effect of resin binder therapy No C. difficile assay seen in the record.  Need to consider the possibility of pancreatic exocrine insufficiency.  Recommendations:  Please cut back on your Colestid to 1 tablet twice daily  Continue to use Gas-X as needed.  Begin ALIGN, a probiotic, daily.  It is over-the-counter.  Send stool sample for fecal elastase and C. difficile testing  Magnesium supplement prescribed by your doctor is okay to take.  Agree with taking pantoprazole or Protonix 40 mg once daily.  Office visit here in 3 months but further recommendations to follow once I get the stool study results back for review.   Notice: This dictation was prepared with Dragon dictation along with smaller phrase technology. Any transcriptional errors that result from this process are unintentional and may not be corrected  upon review.

## 2022-10-24 NOTE — Patient Instructions (Signed)
It was good to see you again today!  Please cut back on your Colestid to 1 tablet twice daily  Continue to use Gas-X as needed.  Begin ALIGN, a probiotic, daily.  It is over-the-counter.  Send stool sample for fecal elastase and C. difficile testing  Magnesium supplement prescribed by your doctor is okay to take.  Agree with taking pantoprazole or Protonix 40 mg once daily.  Office visit here in 3 months but further recommendations to follow once I get the stool study results back for review.

## 2022-10-25 ENCOUNTER — Other Ambulatory Visit: Payer: Self-pay

## 2022-10-25 DIAGNOSIS — R197 Diarrhea, unspecified: Secondary | ICD-10-CM

## 2022-10-25 NOTE — Addendum Note (Signed)
Addended by: Zada Finders on: 10/25/2022 03:12 PM   Modules accepted: Orders

## 2022-10-31 NOTE — Progress Notes (Signed)
Pt dropped off stool samples to [facility] in [place] today (10/31/2022)

## 2022-10-31 NOTE — Progress Notes (Signed)
Lmom for pt to return call. 

## 2022-11-07 ENCOUNTER — Telehealth: Payer: Self-pay

## 2022-11-07 NOTE — Telephone Encounter (Signed)
Stool studies are scanned under media for review.

## 2022-11-16 NOTE — Telephone Encounter (Signed)
Pt was made aware and verbalized understanding. Pt was scheduled f/u with [facility]

## 2022-11-16 NOTE — Telephone Encounter (Signed)
Pt called back today wanting to know what these results showed. Please advise.

## 2022-11-20 NOTE — Progress Notes (Signed)
GI Office Note    Referring Provider: Glori Bickers, MD Primary Care Physician:  Glori Bickers, MD  Primary Gastroenterologist: Roetta Sessions, MD   Chief Complaint   Chief Complaint  Patient presents with   Follow-up    Follow  up. Gassy and stomach cramps.     History of Present Illness   Thomas Simon is a 77 y.o. male presenting today for follow of chronic diarrhea at the request of Dr. Jena Gauss. He was last seen 10/24/22.   Since his last OV he decreased Colestid to 1 tablet BID to control diarrhea. Continued Gas-X, one tablet with meals. Previously cholestyramine helped diarrhea but patient did not like the taste. He started Align, initially he noted crampy abdominal pain so he stopped the medication. He started it about 1-2 weeks ago and seems to be tolerating now. He has ongoing gas. He sees knots of gas/bowel through his abdominal wall. He can press on the area and hear the gas. BM solid now, 2-3 per day. No abdominal pain. Stomach rolls all the time. Makes a lot of noise. No melena, brbpr. Appetite is good. No heartburn, N/V. No weight loss recently.    Colonoscopy completed in 02/2022, he had diverticulosis, random colon biopsies were negative.   CT A/P with contrast 10/2021: unremarkable  11/2022: pancreatic elastase 377. Cdiff toxin by PCP negative.   02/2022: GIP negative.      Medications   Current Outpatient Medications  Medication Sig Dispense Refill   apixaban (ELIQUIS) 5 MG TABS tablet Take 5 mg by mouth 2 (two) times daily.     Calcium-Magnesium-Zinc 333-133-5 MG TABS Take 1 tablet by mouth 2 (two) times daily.     Cholecalciferol (VITAMIN D3) 50 MCG (2000 UT) TABS Take 2,000 Units by mouth daily.     colestipol (COLESTID) 1 g tablet Take 4 tablets (4 g total) by mouth daily. Separate from other medications by at least 3 hours. Start after you run out of cholestyramine powder. (Patient taking differently: Take 1 g by mouth 2 (two) times daily.  Separate from other medications by at least 3 hours. Start after you run out of cholestyramine powder.) 120 tablet 5   doxazosin (CARDURA) 4 MG tablet Take 4 mg by mouth at bedtime.     ferrous sulfate 325 (65 FE) MG tablet Take 325 mg by mouth 2 (two) times daily with a meal.     gabapentin (NEURONTIN) 300 MG capsule Take 300 mg by mouth 2 (two) times daily.     Krill Oil Omega-3 500 MG CAPS Take 500 mg by mouth daily.     levothyroxine (SYNTHROID) 25 MCG tablet Take 25 mcg by mouth daily before breakfast.     losartan (COZAAR) 25 MG tablet Take 75 mg by mouth daily.     losartan (COZAAR) 50 MG tablet Take 75 mg by mouth daily.     magnesium oxide (MAG-OX) 400 MG tablet Take 1 tablet by mouth every other day.     metoprolol succinate (TOPROL XL) 25 MG 24 hr tablet Take 1 tablet (25 mg total) by mouth daily. (Patient taking differently: Take 25 mg by mouth daily. At night) 90 tablet 3   Multiple Vitamins-Minerals (MULTIVITAMIN WITH MINERALS) tablet Take 1 tablet by mouth daily.     nitroGLYCERIN (NITROSTAT) 0.4 MG SL tablet Place 1 tablet (0.4 mg total) under the tongue every 5 (five) minutes as needed for chest pain (for chest pain.). first sign of chest pain or tightness  place one tablet under your tongue. Let the tablet dissolve under the tongue. Do not swallow whole.  Do not eat or drink, smoke or chew tobacco while a tablet is dissolving. If you are not better within 5 minutes after taking ONE dose of nitroglycerin, you may take a SECOND dose. IF YOU ARE GETTING TO THE 3RD DOSE CALL 911. 25 tablet 3   pantoprazole (PROTONIX) 40 MG tablet Take 40 mg by mouth 2 (two) times daily.     simvastatin (ZOCOR) 20 MG tablet Take 20 mg by mouth at bedtime.     terbinafine (LAMISIL) 250 MG tablet Take 250 mg by mouth daily.     vitamin B-12 (CYANOCOBALAMIN) 1000 MCG tablet Take 1,000 mcg by mouth daily.     No current facility-administered medications for this visit.    Allergies   Allergies as of  11/21/2022 - Review Complete 11/21/2022  Allergen Reaction Noted   Celecoxib Shortness Of Breath 04/16/2020   Hydrocodone Other (See Comments) 04/16/2020   Tramadol Other (See Comments) 04/16/2020   Fentanyl Rash 04/16/2020      Review of Systems   General: Negative for anorexia, weight loss, fever, chills, fatigue, weakness. ENT: Negative for hoarseness, difficulty swallowing , nasal congestion. CV: Negative for chest pain, angina, palpitations, dyspnea on exertion, peripheral edema.  Respiratory: Negative for dyspnea at rest, dyspnea on exertion, cough, sputum, wheezing.  GI: See history of present illness. GU:  Negative for dysuria, hematuria, urinary incontinence, urinary frequency, nocturnal urination.  Endo: Negative for unusual weight change.     Physical Exam   BP (!) 136/55 (BP Location: Left Arm, Patient Position: Sitting, Cuff Size: Normal)   Pulse 67   Temp 97.8 F (36.6 C) (Temporal)   Ht 5\' 7"  (1.702 m)   Wt 148 lb 12.8 oz (67.5 kg)   SpO2 96%   BMI 23.31 kg/m    General: Well-nourished, well-developed in no acute distress.  Eyes: No icterus. Mouth: Oropharyngeal mucosa moist and pink , no lesions erythema or exudate. Abdomen: Bowel sounds are normal, nontender, nondistended, no hepatosplenomegaly or masses, no abdominal bruits or hernia , no rebound or guarding. Thin abd wall, rectus diastasis. You can palpate bowel and gas, migrates on exam. Rectal: not performed Extremities: No lower extremity edema. No clubbing or deformities. Neuro: Alert and oriented x 4   Skin: Warm and dry, no jaundice.   Psych: Alert and cooperative, normal mood and affect.  Labs   Lab Results  Component Value Date   NA 139 08/01/2022   CL 103 08/01/2022   K 4.1 08/01/2022   CO2 30 08/01/2022   BUN 22 08/01/2022   CREATININE 0.74 08/01/2022   GFRNONAA >60 11/15/2021   CALCIUM 8.6 08/01/2022   ALBUMIN 3.2 (L) 09/28/2021   GLUCOSE 109 (H) 08/01/2022   Lab Results  Component  Value Date   ALT 19 09/28/2021   AST 17 09/28/2021   ALKPHOS 61 09/28/2021   BILITOT 1.4 (H) 09/28/2021   Lab Results  Component Value Date   WBC 11.6 (H) 11/15/2021   HGB 14.9 11/15/2021   HCT 44.3 11/15/2021   MCV 98.4 11/15/2021   PLT 134 (L) 11/15/2021   Lab Results  Component Value Date   TSH 1.954 09/28/2021    Imaging Studies   No results found.  Assessment   *Diarrhea *Gas   Diarrhea controlled with colestid. Biggest concern of flatulence. Extensive work up as outlined above. Really has not been on probiotic long enough to  know if it will be helpful.    PLAN   Continue colesitd one tablet twice a day. Continue Align at least four more weeks to see if effective for gas. Call if not improved.    Leanna Battles. Melvyn Neth, MHS, PA-C [facility]

## 2022-11-21 ENCOUNTER — Ambulatory Visit (INDEPENDENT_AMBULATORY_CARE_PROVIDER_SITE_OTHER): Payer: Medicare Other | Admitting: Gastroenterology

## 2022-11-21 ENCOUNTER — Encounter: Payer: Self-pay | Admitting: Gastroenterology

## 2022-11-21 VITALS — BP 136/55 | HR 67 | Temp 97.8°F | Ht 67.0 in | Wt 148.8 lb

## 2022-11-21 DIAGNOSIS — R197 Diarrhea, unspecified: Secondary | ICD-10-CM | POA: Diagnosis not present

## 2022-11-21 NOTE — Patient Instructions (Signed)
Continue colestid tablet one twice a day, separate from other medications by two hours. Continue Align for at least 4 more weeks, then call my CMA, Tammy at [phone] and let us know if gas has improved. If not, we will try another medication.

## 2022-12-14 ENCOUNTER — Encounter: Payer: Self-pay | Admitting: Internal Medicine

## 2022-12-31 NOTE — Progress Notes (Signed)
GI Office Note    Referring Provider: Glori Bickers, MD Primary Care Physician:  Glori Bickers, MD  Primary Gastroenterologist: Roetta Sessions, MD   Chief Complaint   No chief complaint on file.   History of Present Illness   Thomas Simon is a 77 y.o. male presenting today for follow up. Last seen in 11/2022. History of chronic diarrhea.   Was doing well at last ov on colestid and Align, biggest concern of flatulence. Previously Questran controlled diarrhea but he did not like the taste.    Colonoscopy completed in 02/2022, he had diverticulosis, random colon biopsies were negative.    CT A/P with contrast 10/2021: unremarkable   11/2022: pancreatic elastase 377. Cdiff toxin by PCP negative.    02/2022: GIP negative.   Medications   Current Outpatient Medications  Medication Sig Dispense Refill   apixaban (ELIQUIS) 5 MG TABS tablet Take 5 mg by mouth 2 (two) times daily.     Calcium-Magnesium-Zinc 333-133-5 MG TABS Take 1 tablet by mouth 2 (two) times daily.     Cholecalciferol (VITAMIN D3) 50 MCG (2000 UT) TABS Take 2,000 Units by mouth daily.     colestipol (COLESTID) 1 g tablet Take 4 tablets (4 g total) by mouth daily. Separate from other medications by at least 3 hours. Start after you run out of cholestyramine powder. (Patient taking differently: Take 1 g by mouth 2 (two) times daily. Separate from other medications by at least 3 hours. Start after you run out of cholestyramine powder.) 120 tablet 5   doxazosin (CARDURA) 4 MG tablet Take 4 mg by mouth at bedtime.     ferrous sulfate 325 (65 FE) MG tablet Take 325 mg by mouth 2 (two) times daily with a meal.     gabapentin (NEURONTIN) 300 MG capsule Take 300 mg by mouth 2 (two) times daily.     Krill Oil Omega-3 500 MG CAPS Take 500 mg by mouth daily.     levothyroxine (SYNTHROID) 25 MCG tablet Take 25 mcg by mouth daily before breakfast.     losartan (COZAAR) 25 MG tablet Take 75 mg by mouth daily.      losartan (COZAAR) 50 MG tablet Take 75 mg by mouth daily.     magnesium oxide (MAG-OX) 400 MG tablet Take 1 tablet by mouth every other day.     metoprolol succinate (TOPROL XL) 25 MG 24 hr tablet Take 1 tablet (25 mg total) by mouth daily. (Patient taking differently: Take 25 mg by mouth daily. At night) 90 tablet 3   Multiple Vitamins-Minerals (MULTIVITAMIN WITH MINERALS) tablet Take 1 tablet by mouth daily.     nitroGLYCERIN (NITROSTAT) 0.4 MG SL tablet Place 1 tablet (0.4 mg total) under the tongue every 5 (five) minutes as needed for chest pain (for chest pain.). first sign of chest pain or tightness place one tablet under your tongue. Let the tablet dissolve under the tongue. Do not swallow whole.  Do not eat or drink, smoke or chew tobacco while a tablet is dissolving. If you are not better within 5 minutes after taking ONE dose of nitroglycerin, you may take a SECOND dose. IF YOU ARE GETTING TO THE 3RD DOSE CALL 911. 25 tablet 3   pantoprazole (PROTONIX) 40 MG tablet Take 40 mg by mouth 2 (two) times daily.     Probiotic Product (ALIGN) 4 MG CAPS Take 4 mg by mouth daily.     simvastatin (ZOCOR) 20 MG tablet Take 20 mg  by mouth at bedtime.     terbinafine (LAMISIL) 250 MG tablet Take 250 mg by mouth daily.     vitamin B-12 (CYANOCOBALAMIN) 1000 MCG tablet Take 1,000 mcg by mouth daily.     No current facility-administered medications for this visit.    Allergies   Allergies as of 01/01/2023 - Review Complete 11/21/2022  Allergen Reaction Noted   Celecoxib Shortness Of Breath 04/16/2020   Hydrocodone Other (See Comments) 04/16/2020   Tramadol Other (See Comments) 04/16/2020   Fentanyl Rash 04/16/2020     Past Medical History   Past Medical History:  Diagnosis Date   Anemia    Coronary arteriosclerosis    GERD (gastroesophageal reflux disease)    Hyperlipidemia    Hypertension    Persistent atrial fibrillation (HCC)    PVD (peripheral vascular disease) (HCC)    Thyroid  disease     Past Surgical History   Past Surgical History:  Procedure Laterality Date   ATRIAL FIBRILLATION ABLATION N/A 06/15/2020   Procedure: ATRIAL FIBRILLATION ABLATION;  Surgeon: Hillis Range, MD;  Location: [facility] INVASIVE CV LAB;  Service: Cardiovascular;  Laterality: N/A;   BIOPSY  02/22/2022   Procedure: BIOPSY;  Surgeon: Corbin Ade, MD;  Location: [facility] ENDO SUITE;  Service: Endoscopy;;   CARDIOVERSION N/A 04/30/2020   Procedure: CARDIOVERSION;  Surgeon: Chilton Si, MD;  Location: [facility] ENDOSCOPY;  Service: Cardiovascular;  Laterality: N/A;   CARDIOVERSION N/A 06/23/2020   Procedure: CARDIOVERSION;  Surgeon: Vesta Mixer, MD;  Location: [facility] ENDOSCOPY;  Service: Cardiovascular;  Laterality: N/A;   CHOLECYSTECTOMY     COLONOSCOPY WITH PROPOFOL N/A 02/22/2022   Procedure: COLONOSCOPY WITH PROPOFOL;  Surgeon: Corbin Ade, MD;  Location: [facility] ENDO SUITE;  Service: Endoscopy;  Laterality: N/A;  12:15pm, asa 3, MB is full   KNEE ARTHROSCOPY W/ PARTIAL MEDIAL MENISCECTOMY     open heart surgery     3vessels   ROTATOR CUFF REPAIR      Past Family History   Family History  Problem Relation Age of Onset   Heart failure Mother    Lung disease Father    Colon cancer Neg Hx     Past Social History   Social History   Socioeconomic History   Marital status: Married    Spouse name: Not on file   Number of children: Not on file   Years of education: Not on file   Highest education level: Not on file  Occupational History   Not on file  Tobacco Use   Smoking status: Former   Smokeless tobacco: Current    Types: Chew  Vaping Use   Vaping status: Unknown  Substance and Sexual Activity   Alcohol use: Never   Drug use: Never   Sexual activity: Yes  Other Topics Concern   Not on file  Social History Narrative   Not on file   Social Determinants of Health   Financial Resource Strain: Not on file  Food Insecurity: Not on file  Transportation Needs: Not on file   Physical Activity: Not on file  Stress: Not on file  Social Connections: Not on file  Intimate Partner Violence: Not on file    Review of Systems   General: Negative for anorexia, weight loss, fever, chills, fatigue, weakness. ENT: Negative for hoarseness, difficulty swallowing , nasal congestion. CV: Negative for chest pain, angina, palpitations, dyspnea on exertion, peripheral edema.  Respiratory: Negative for dyspnea at rest, dyspnea on exertion, cough, sputum, wheezing.  GI: See  history of present illness. GU:  Negative for dysuria, hematuria, urinary incontinence, urinary frequency, nocturnal urination.  Endo: Negative for unusual weight change.     Physical Exam   There were no vitals taken for this visit.   General: Well-nourished, well-developed in no acute distress.  Eyes: No icterus. Mouth: Oropharyngeal mucosa moist and pink , no lesions erythema or exudate. Lungs: Clear to auscultation bilaterally.  Heart: Regular rate and rhythm, no murmurs rubs or gallops.  Abdomen: Bowel sounds are normal, nontender, nondistended, no hepatosplenomegaly or masses,  no abdominal bruits or hernia , no rebound or guarding.  Rectal: ***  Extremities: No lower extremity edema. No clubbing or deformities. Neuro: Alert and oriented x 4   Skin: Warm and dry, no jaundice.   Psych: Alert and cooperative, normal mood and affect.  Labs   *** Imaging Studies   No results found.  Assessment       PLAN   ***   Leanna Battles. Melvyn Neth, MHS, PA-C [facility]

## 2023-01-01 ENCOUNTER — Encounter: Payer: Self-pay | Admitting: Gastroenterology

## 2023-01-01 ENCOUNTER — Ambulatory Visit (INDEPENDENT_AMBULATORY_CARE_PROVIDER_SITE_OTHER): Payer: Medicare Other | Admitting: Gastroenterology

## 2023-01-01 VITALS — BP 131/64 | HR 59 | Temp 97.7°F | Ht 67.0 in | Wt 147.0 lb

## 2023-01-01 DIAGNOSIS — K638219 Small intestinal bacterial overgrowth, unspecified: Secondary | ICD-10-CM

## 2023-01-01 DIAGNOSIS — R143 Flatulence: Secondary | ICD-10-CM | POA: Diagnosis not present

## 2023-01-01 MED ORDER — RIFAXIMIN 550 MG PO TABS: 550.0000 mg | ORAL_TABLET | Freq: Three times a day (TID) | ORAL | 0 refills | Status: AC

## 2023-01-01 NOTE — Patient Instructions (Addendum)
Trial of Xifaxan 550mg  three times a day for 14 days for small bowel bacterial overgrowth which could be contributing to your gas.  If medication is expensive, please call and ask for alternative.   Limit foods from the list below as they may cause more digestive gas. See bloating handout for lifestyle changes to reduce gas.   Please call my CMA Tammy at [phone] after you have completed course of Xifaxan and let us know how you are doing.     Foods to avoid/limit Fruits Fresh, dried, and juiced forms of apple, pear, watermelon, peach, plum, cherries, apricots, blackberries, boysenberries, figs, nectarines, and mango. Avocado. Vegetables Chicory root, artichoke, asparagus, cabbage, snow peas, Brussels sprouts, broccoli, sugar snap peas, mushrooms, celery, and cauliflower. Onions, garlic, leeks, and the white part of scallions. Grains Wheat, including kamut, durum, and semolina. Barley and bulgur. Couscous. Wheat-based cereals. Wheat noodles, bread, crackers, and pastries. Meats and other proteins Fried or fatty meat. Sausage. Cashews and pistachios. Soybeans, baked beans, black beans, chickpeas, kidney beans, fava beans, navy beans, lentils, black-eyed peas, and split peas. Dairy Milk, yogurt, ice cream, and soft cheese. Cream and sour cream. Milk-based sauces. Custard. Buttermilk. Soy milk. Seasoning and other foods Any sugar-free gum or candy. Foods that contain artificial sweeteners such as sorbitol, mannitol, isomalt, or xylitol. Foods that contain honey, high-fructose corn syrup, or agave. Bouillon, vegetable stock, beef stock, and chicken stock. Garlic and onion powder. Condiments made with onion, such as hummus, chutney, pickles, relish, salad dressing, and salsa. Tomato paste. Beverages Chicory-based drinks. Coffee substitutes. Chamomile tea. Fennel tea. Sweet or fortified wines such as port or sherry. Diet soft drinks made with isomalt, mannitol, maltitol, sorbitol, or xylitol.  Apple, pear, and mango juice. Juices with high-fructose corn syrup. The items listed above may not be a complete list of foods and beverages you should avoid. Contact a dietitian for more information.

## 2023-01-02 ENCOUNTER — Telehealth: Payer: Self-pay

## 2023-01-02 MED ORDER — AMOXICILLIN-POT CLAVULANATE 875-125 MG PO TABS: 1.0000 | ORAL_TABLET | Freq: Two times a day (BID) | ORAL | 0 refills | Status: AC

## 2023-01-02 NOTE — Telephone Encounter (Signed)
Pt was made aware and verbalized understanding.  

## 2023-01-02 NOTE — Telephone Encounter (Signed)
Pt called stating that the xifaxan is too expensive and is wanting to know what you suggest.

## 2023-01-02 NOTE — Addendum Note (Signed)
Addended by: Tiffany Kocher on: 01/02/2023 03:13 PM   Modules accepted: Orders

## 2023-01-02 NOTE — Telephone Encounter (Signed)
Rx for augmentin for 10 days sent to pharmacy

## 2023-01-18 ENCOUNTER — Telehealth: Payer: Self-pay

## 2023-01-18 NOTE — Telephone Encounter (Signed)
Pt called stating that he was the best he had been in 5 years while taking the augmentin and that he had a good appetite. Pt states that two days after finishing it he was back to the way he was. Pt is wanting to know what you suggest.

## 2023-01-22 MED ORDER — DOXYCYCLINE HYCLATE 100 MG PO TABS: 100.0000 mg | ORAL_TABLET | Freq: Two times a day (BID) | ORAL | 0 refills | Status: AC

## 2023-01-22 NOTE — Telephone Encounter (Signed)
Please let pt know I am glad he saw a response. It is acceptable to treat with one more round of antibiotics but will give him a different one then before per recommendations. Unfortunately if his symptoms return off antibiotic again, we cannot repeat another course this soon.  I will send in 10 days of doxycycline.  He needs ov with Dr. Jena Gauss only in four weeks.

## 2023-01-22 NOTE — Telephone Encounter (Signed)
Pt called back checking on this. Please advise.

## 2023-01-22 NOTE — Addendum Note (Signed)
Addended by: Tiffany Kocher on: 01/22/2023 09:54 AM   Modules accepted: Orders

## 2023-01-23 IMAGING — CT CT HEART MORPH/PULM VEIN W/ CM & W/O CA SCORE
2 of 5 series · 11 of 20 positions shown, 13 images · IV contrast (APPLIED)
Comparison: None.
COMPARISON: None.

Addendum:
EXAM:
OVER-READ INTERPRETATION  CT CHEST

The following report is an over-read performed by radiologist Dr.
Seosamh Lochaden [REDACTED] on 06/08/2020. This
over-read does not include interpretation of cardiac or coronary
anatomy or pathology. The coronary calcium score/coronary CTA
interpretation by the cardiologist is attached.
CLINICAL DATA: 74M with hypertension, hyperlipidemia, diabetes,
PAD, and CAD with atrial fibrillation scheduled for an ablation.
Cardiac CT/CTA
TECHNIQUE: The patient was scanned on a Siemens Somatom scanner.

[Series 6: best syst · axial · 0.39mm/px · z∈[+1155,+1241]mm · 8 of 277 slices shown, 10 images]
[im 31/277  vessel]
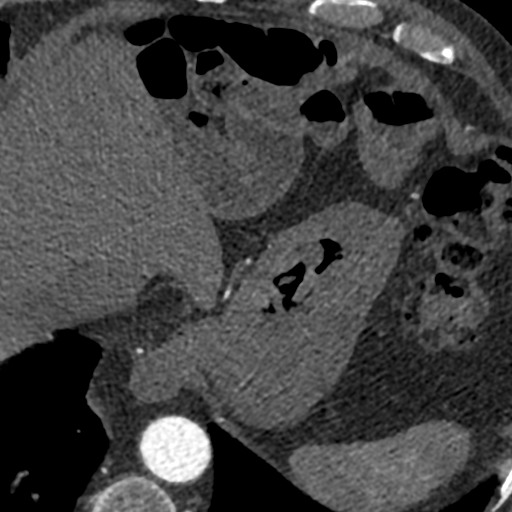
[im 31/277  lung]
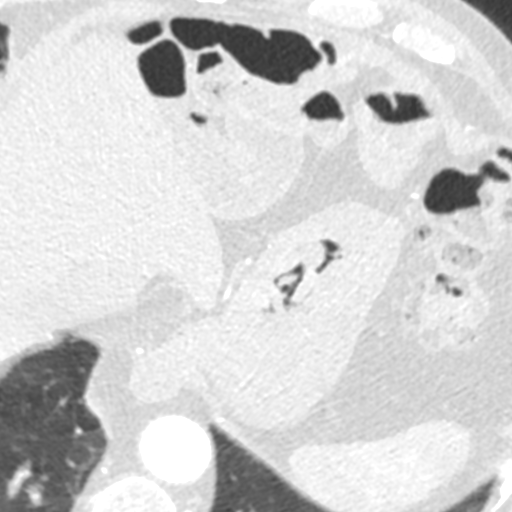
[im 62/277  vessel]
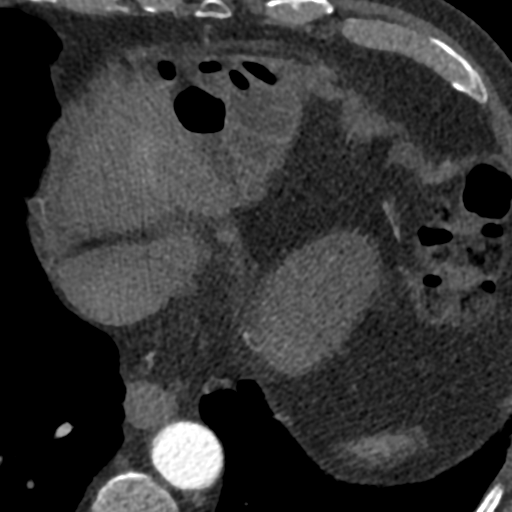
[im 93/277  vessel]
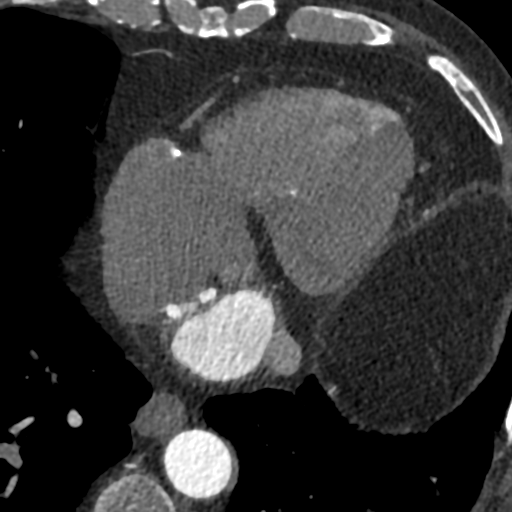
[im 123/277  vessel]
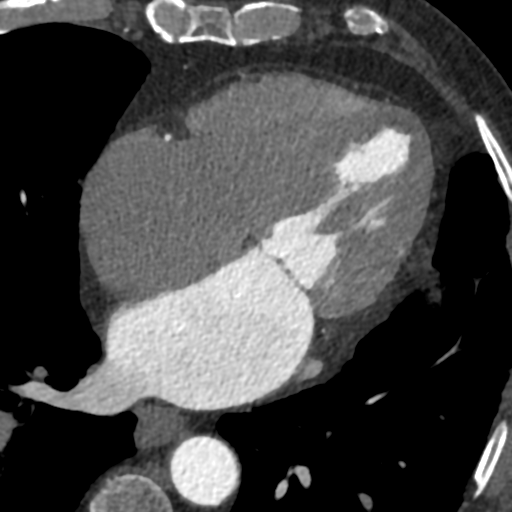
[im 154/277  vessel]
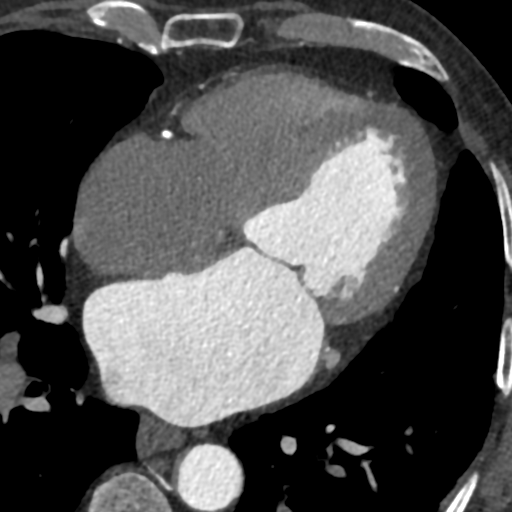
[im 154/277  lung]
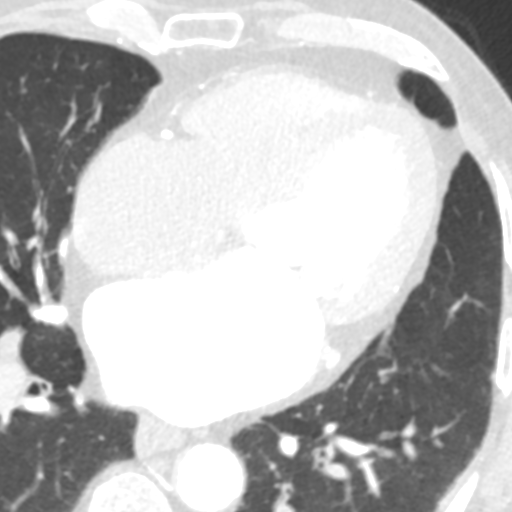
[im 185/277  vessel]
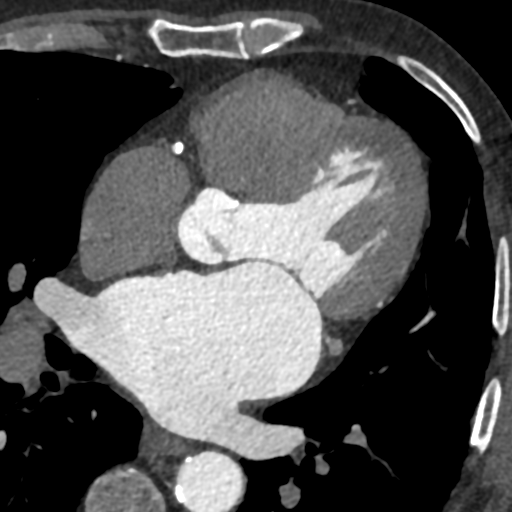
[im 215/277  vessel]
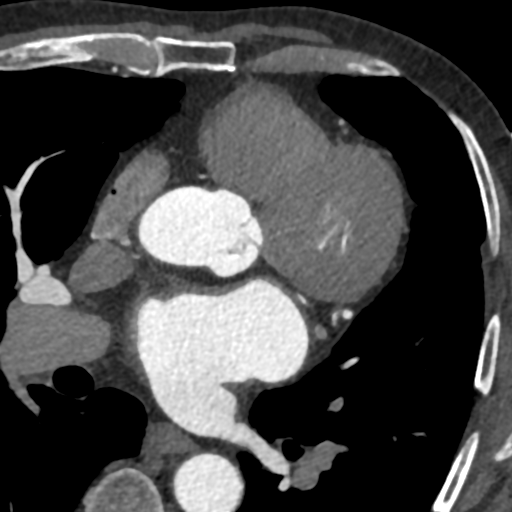
[im 246/277  vessel]
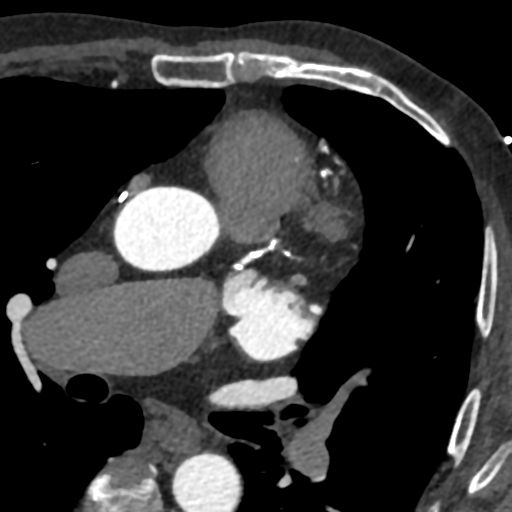

[Series 7: pv delay · axial · portal-venous · 0.39mm/px · z∈[+1206,+1238]mm · 3 of 128 slices shown]
[im 32/128  vessel]
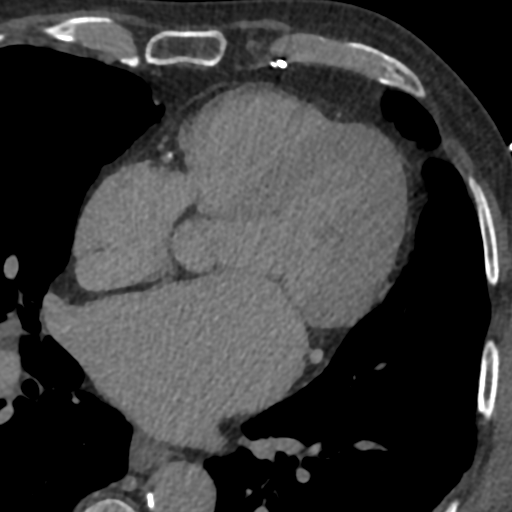
[im 64/128  vessel]
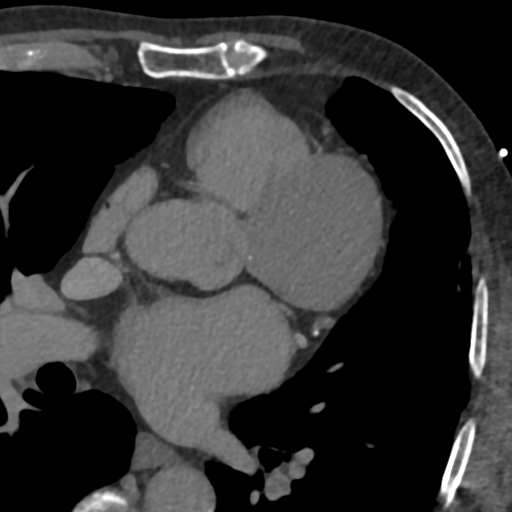
[im 96/128  vessel]
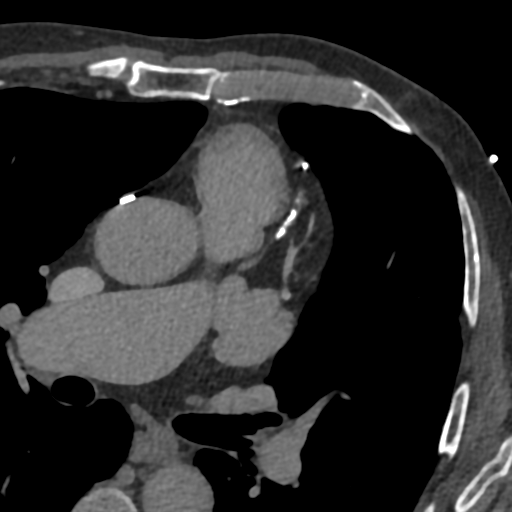

[11 of 20 positions shown; findings below may reference images not displayed]

FINDINGS: Within the visualized portions of the thorax there are no suspicious
appearing pulmonary nodules or masses, there is no acute
consolidative airspace disease, no pleural effusions, no
pneumothorax and no lymphadenopathy. Visualized portions of the
upper abdomen are unremarkable. There are no aggressive appearing
lytic or blastic lesions noted in the visualized portions of the
skeleton.
IMPRESSION: 1. No significant incidental noncardiac findings are noted.
FINDINGS: A 120 kV prospective scan was triggered in the descending thoracic
aorta at 111 HU's. Gantry rotation speed was 280 msecs and
collimation was .9 mm. No beta blockade and no NTG was given. The 3D
data set was reconstructed in 5% intervals of the 60-80 % of the R-R
cycle. Diastolic phases were analyzed on a dedicated work station
using MPR, MIP and VRT modes. The patient received 80 cc of
contrast.

There is normal pulmonary vein drainage into the left atrium (2 on
the right and 2 on the left) with ostial measurements as follows:

RUPV: 24.3 x 19.9 mm

RLPV: 20.0 x 15.3 mm

LUPV: 17.8 x 14.3 mm

LLPV: 18.8 x 16.8 mm

The left atrial appendage is a broccoli type with two lobes and
ostial size 26 x23 mm and length 31 mm. There is no thrombus in the
left atrial appendage.

The esophagus runs in the left atrial midline and is not in the
proximity to any of the pulmonary veins.

Aorta: Normal caliber. Ascending aorta 3.4 cm. No dissection. There
is calcification of the aortic root and descending aorta.

Aortic Valve:  Trileaflet.  No calcifications.

Coronary Arteries: Normal coronary origin. Right dominance. Diffuse
native vessel calcification with evidence of prior CABG. The study
was performed without use of NTG and insufficient for plaque
evaluation.
IMPRESSION: 1. There is normal pulmonary vein drainage into the left atrium.

2. The left atrial appendage is a broccoli type with two lobes and
ostial size 26 x23 mm and length 31 mm. There is no thrombus in the
left atrial appendage.

3. The esophagus runs in the left atrial midline and is not in the
proximity to any of the pulmonary veins.

4. Diffuse native coronary calcification with evidence of prior
CABG.

*** End of Addendum ***
EXAM:
OVER-READ INTERPRETATION  CT CHEST

The following report is an over-read performed by radiologist Dr.
Seosamh Lochaden [REDACTED] on 06/08/2020. This
over-read does not include interpretation of cardiac or coronary
anatomy or pathology. The coronary calcium score/coronary CTA
interpretation by the cardiologist is attached.
FINDINGS: Within the visualized portions of the thorax there are no suspicious
appearing pulmonary nodules or masses, there is no acute
consolidative airspace disease, no pleural effusions, no
pneumothorax and no lymphadenopathy. Visualized portions of the
upper abdomen are unremarkable. There are no aggressive appearing
lytic or blastic lesions noted in the visualized portions of the
skeleton.
IMPRESSION: 1. No significant incidental noncardiac findings are noted.

## 2023-01-23 NOTE — Telephone Encounter (Signed)
Pt was made aware and verbalized understanding. Appt with Dr. Jena Gauss only was made.

## 2023-02-20 ENCOUNTER — Ambulatory Visit (INDEPENDENT_AMBULATORY_CARE_PROVIDER_SITE_OTHER): Payer: Medicare Other | Admitting: Internal Medicine

## 2023-02-20 ENCOUNTER — Encounter: Payer: Self-pay | Admitting: Internal Medicine

## 2023-02-20 VITALS — BP 130/61 | HR 53 | Temp 97.8°F | Ht 67.0 in | Wt 148.4 lb

## 2023-02-20 DIAGNOSIS — R143 Flatulence: Secondary | ICD-10-CM

## 2023-02-20 DIAGNOSIS — K219 Gastro-esophageal reflux disease without esophagitis: Secondary | ICD-10-CM | POA: Diagnosis not present

## 2023-02-20 DIAGNOSIS — R197 Diarrhea, unspecified: Secondary | ICD-10-CM

## 2023-02-20 NOTE — Patient Instructions (Signed)
Good to see you again today!  I am glad your diarrhea has gotten much better.  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  But hopefully your good days will outnumber your bad days  If you have recurrent diarrhea, please let us know  I recommend you take align every day indefinitely  As far as reflux is concerned try taking Protonix 40 mg 30 minutes before breakfast once daily for control.  If this is not adequate, go ahead and increase to twice daily but take this medication before meals  Office visit with [place] in 6 months

## 2023-02-20 NOTE — Progress Notes (Signed)
Primary Care Physician:  Glori Bickers, MD Primary Gastroenterologist:  Dr. Jena Gauss  Pre-Procedure History & Physical: HPI:  Thomas Simon is a 77 y.o. male here for for follow-up IBS-D/SIBO.  Xifaxan previously prescribed.  Too expensive.  Went to a course of Augmentin.  This improves symptoms but only for a few weeks and they recurred.  He was cycled to a course of doxycycline.  This resulted in marked improvement in his symptoms at this point in time; he has a quiet abdomen and does not have any diarrhea.  Spouse are quite pleased.  Reflux well-controlled on Protonix twice daily.  He has not tried backing off to once daily.  No dysphagia.  No resin binders at this time.  Takes align daily.  Past Medical History:  Diagnosis Date   Anemia    Coronary arteriosclerosis    GERD (gastroesophageal reflux disease)    Hyperlipidemia    Hypertension    Persistent atrial fibrillation (HCC)    PVD (peripheral vascular disease) (HCC)    Thyroid disease     Past Surgical History:  Procedure Laterality Date   ATRIAL FIBRILLATION ABLATION N/A 06/15/2020   Procedure: ATRIAL FIBRILLATION ABLATION;  Surgeon: Hillis Range, MD;  Location: [facility] INVASIVE CV LAB;  Service: Cardiovascular;  Laterality: N/A;   BIOPSY  02/22/2022   Procedure: BIOPSY;  Surgeon: Corbin Ade, MD;  Location: [facility];  Service: Endoscopy;;   CARDIOVERSION N/A 04/30/2020   Procedure: CARDIOVERSION;  Surgeon: Chilton Si, MD;  Location: [facility] ENDOSCOPY;  Service: Cardiovascular;  Laterality: N/A;   CARDIOVERSION N/A 06/23/2020   Procedure: CARDIOVERSION;  Surgeon: Vesta Mixer, MD;  Location: [facility] ENDOSCOPY;  Service: Cardiovascular;  Laterality: N/A;   CHOLECYSTECTOMY     COLONOSCOPY WITH PROPOFOL N/A 02/22/2022   Procedure: COLONOSCOPY WITH PROPOFOL;  Surgeon: Corbin Ade, MD;  Location: [facility]O SU[facility];  Service: Endoscopy;  Laterality: N/A;  12:15pm, asa 3, MB is full   KNEE ARTHROSCOPY W/ PARTIAL MEDIAL  MENISCECTOMY     open heart surgery     3vessels   ROTATOR CUFF REPAIR      Prior to Admission medications   Medication Sig Start Date End Date Taking? Authorizing Provider  apixaban (ELIQUIS) 5 MG TABS tablet Take 5 mg by mouth 2 (two) times daily.   Yes [provider]  Calcium-Magnesium-Zinc [phone] MG TABS Take 1 tablet by mouth 2 (two) times daily. 10/18/22  Yes [provider]  Cholecalciferol (VITAMIN D3) 50 MCG (2000 UT) TABS Take 2,000 Units by mouth daily.   Yes [provider]  doxazosin (CARDURA) 4 MG tablet Take 4 mg by mouth at bedtime.   Yes [provider]  ferrous sulfate 325 (65 FE) MG tablet Take 325 mg by mouth 2 (two) times daily with a meal.   Yes [provider]  gabapentin (NEURONTIN) 300 MG capsule Take 300 mg by mouth 2 (two) times daily.   Yes [provider]  [place] Lanius Omega-3 500 MG CAPS Take 500 mg by mouth daily.   Yes [provider]  levothyroxine (SYNTHROID) 25 MCG tablet Take 25 mcg by mouth daily before breakfast.   Yes [provider]  losartan (COZAAR) 100 MG tablet Take 100 mg by mouth daily. 02/07/23  Yes [provider]  magnesium oxide (MAG-OX) 400 MG tablet Take 1 tablet by mouth every other day. 03/22/22  Yes [provider]  metoprolol succinate (TOPROL XL) 25 MG 24 hr tablet Take 1  tablet (25 mg total) by mouth daily. Patient taking differently: Take 25 mg by mouth daily. At night 09/13/20  Yes Allred, Fayrene Fearing, MD  Multiple Vitamins-Minerals (MULTIVITAMIN WITH MINERALS) tablet Take 1 tablet by mouth daily.   Yes [provider]  nitroGLYCERIN (NITROSTAT) 0.4 MG SL tablet Place 1 tablet (0.4 mg total) under the tongue every 5 (five) minutes as needed for chest pain (for chest pain.). first sign of chest pain or tightness place one tablet under your tongue. Let the tablet dissolve under the tongue. Do not swallow whole.  Do not eat or drink, smoke or chew  tobacco while a tablet is dissolving. If you are not better within 5 minutes after taking ONE dose of nitroglycerin, you may take a SECOND dose. IF YOU ARE GETTING TO THE 3RD DOSE CALL 911. 03/29/21  Yes Allred, Fayrene Fearing, MD  pantoprazole (PROTONIX) 40 MG tablet Take 40 mg by mouth 2 (two) times daily.   Yes [provider]  Probiotic Product (ALIGN) 4 MG CAPS Take 4 mg by mouth daily.   Yes [provider]  simvastatin (ZOCOR) 20 MG tablet Take 20 mg by mouth at bedtime.   Yes [provider]  terbinafine (LAMISIL) 250 MG tablet Take 250 mg by mouth daily. 07/10/22  Yes [provider]  vitamin B-12 (CYANOCOBALAMIN) 1000 MCG tablet Take 1,000 mcg by mouth daily.   Yes [provider]    Allergies as of 02/20/2023 - Review Complete 02/20/2023  Allergen Reaction Noted   Celecoxib Shortness Of Breath 04/16/2020   Hydrocodone Other (See Comments) 04/16/2020   Tramadol Other (See Comments) 04/16/2020   Fentanyl Rash 04/16/2020    Family History  Problem Relation Age of Onset   Heart failure Mother    Lung disease Father    Colon cancer Neg Hx     Social History   Socioeconomic History   Marital status: Married    Spouse name: Not on file   Number of children: Not on file   Years of education: Not on file   Highest education level: Not on file  Occupational History   Not on file  Tobacco Use   Smoking status: Former   Smokeless tobacco: Current    Types: Chew  Vaping Use   Vaping status: Unknown  Substance and Sexual Activity   Alcohol use: Never   Drug use: Never   Sexual activity: Yes  Other Topics Concern   Not on file  Social History Narrative   Not on file   Social Determinants of Health   Financial Resource Strain: Not on file  Food Insecurity: Not on file  Transportation Needs: Not on file  Physical Activity: Not on file  Stress: Not on file  Social Connections: Not on file  Intimate Partner Violence: Not on file     Review of Systems: See HPI, otherwise negative ROS  Physical Exam: BP 130/61 (BP Location: Right Arm, Patient Position: Sitting, Cuff Size: Normal)   Pulse (!) 53   Temp 97.8 F (36.6 C) (Oral)   Ht 5\' 7"  (1.702 m)   Wt 148 lb 6.4 oz (67.3 kg)   SpO2 99%   BMI 23.24 kg/m  General:   Alert,  Well-developed, well-nourished, pleasant and cooperative in NAD; companied by his wife Abdomen: Non-distended, normal bowel sounds.  Soft and nontender without appreciable mass or hepatosplenomegaly.    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  He has responded to a course of antibiotic therapy.  He  is very happy with symptoms in remission at this time.  I explained to him that there is no cure for IBS and SIBO is a chronic condition as well and remissions and exacerbations over time are the general rule.  Well-controlled on twice daily Protonix.  He may be able to de-escalate to once daily.  Recommendations:  I am glad  diarrhea has gotten much better.  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  But hopefully your good days will outnumber your bad days.  SIBO may be playing a role here or at least a bacterial imbalance  If you have recurrent diarrhea, please let us know  I recommend you take align every day indefinitely  As far as reflux is concerned try taking Protonix 40 mg 30 minutes before breakfast once daily for control.  If this is not adequate, go ahead and increase to twice daily but take this medication before meals  Office visit with [place] in 6 months      Notice: This dictation was prepared with Dragon dictation along with smaller phrase technology. Any transcriptional errors that result from this process are unintentional and may not be corrected upon review.

## 2023-04-09 ENCOUNTER — Ambulatory Visit: Payer: Medicare Other | Admitting: Cardiovascular Disease

## 2023-05-07 ENCOUNTER — Encounter: Payer: Self-pay | Admitting: Gastroenterology

## 2023-05-07 ENCOUNTER — Ambulatory Visit (INDEPENDENT_AMBULATORY_CARE_PROVIDER_SITE_OTHER): Payer: Medicare Other | Admitting: Gastroenterology

## 2023-05-07 VITALS — BP 112/63 | HR 61 | Temp 98.1°F | Ht 67.0 in | Wt 146.4 lb

## 2023-05-07 DIAGNOSIS — A09 Infectious gastroenteritis and colitis, unspecified: Secondary | ICD-10-CM

## 2023-05-07 DIAGNOSIS — R197 Diarrhea, unspecified: Secondary | ICD-10-CM | POA: Diagnosis not present

## 2023-05-07 NOTE — Patient Instructions (Signed)
Complete your stool testing for C.diff infection. If this is negative, we will proceed with a course of doxycycline since you did well previously.   Please call my CMA Tammy at [phone] with any questions or concerns.

## 2023-05-07 NOTE — Progress Notes (Signed)
GI Office Note    Referring Provider: Glori Bickers, MD Primary Care Physician:  Glori Bickers, MD  Primary Gastroenterologist: Roetta Sessions, MD   Chief Complaint   Chief Complaint  Patient presents with   Diarrhea    Going on again for about 2 weeks now    History of Present Illness   Thomas Simon is a 78 y.o. male presenting today for diarrhea.  Last seen November 2024.  He has a history of IBS-D/SIBO.    Previously tried Nurse, adult but was too expensive.  Augmentin improved the symptoms but relapsed within a couple of days of stopping the medication. Course of doxycycline seem to give him some improvement as well, lasting longer period of time. Often his biggest complaint is that of gas.  Rumbling.  Noisy abdomen.  Evaluated several years ago at [facility], suspected IBS.    Colonoscopy completed in 02/2022, he had diverticulosis, random colon biopsies were negative.    CT A/P with contrast 10/2021: unremarkable   11/2022: pancreatic elastase 377. Cdiff toxin by PCP negative.    02/2022: GIP negative.   Today:  Was doing well until about 3 weeks ago. His baseline is 2-3 stools daily but started having 5-6 loose/liquid stools daily. Not really having a lot of gas issues right now. No melena, brbpr. He recently took antibiotic for bronchitis, started 04/11/23, lasting for 5 days. He added colestipol about two weeks ago, 1 BID which has improved consistency of stools somewhat but still having 5-6 per day. Taking kaopectate in evenings before be. No melena, brbpr. No abdominal pain. Appetite is good.     Medications   Current Outpatient Medications  Medication Sig Dispense Refill   apixaban (ELIQUIS) 5 MG TABS tablet Take 5 mg by mouth 2 (two) times daily.     Calcium-Magnesium-Zinc [phone] MG TABS Take 1 tablet by mouth 2 (two) times daily.     Cholecalciferol (VITAMIN D3) 50 MCG (2000 UT) TABS Take 2,000 Units by mouth daily.     doxazosin (CARDURA) 4 MG tablet  Take 4 mg by mouth at bedtime.     ferrous sulfate 325 (65 FE) MG tablet Take 325 mg by mouth 2 (two) times daily with a meal.     gabapentin (NEURONTIN) 300 MG capsule Take 300 mg by mouth 2 (two) times daily.     Krill Oil Omega-3 500 MG CAPS Take 500 mg by mouth daily.     levothyroxine (SYNTHROID) 25 MCG tablet Take 25 mcg by mouth daily before breakfast.     losartan (COZAAR) 100 MG tablet Take 100 mg by mouth daily.     magnesium oxide (MAG-OX) 400 MG tablet Take 1 tablet by mouth every other day.     metoprolol succinate (TOPROL XL) 25 MG 24 hr tablet Take 1 tablet (25 mg total) by mouth daily. (Patient taking differently: Take 25 mg by mouth daily. At night) 90 tablet 3   Multiple Vitamins-Minerals (MULTIVITAMIN WITH MINERALS) tablet Take 1 tablet by mouth daily.     nitroGLYCERIN (NITROSTAT) 0.4 MG SL tablet Place 1 tablet (0.4 mg total) under the tongue every 5 (five) minutes as needed for chest pain (for chest pain.). first sign of chest pain or tightness place one tablet under your tongue. Let the tablet dissolve under the tongue. Do not swallow whole.  Do not eat or drink, smoke or chew tobacco while a tablet is dissolving. If you are not better within 5 minutes after taking ONE  dose of nitroglycerin, you may take a SECOND dose. IF YOU ARE GETTING TO THE 3RD DOSE CALL 911. 25 tablet 3   pantoprazole (PROTONIX) 40 MG tablet Take 40 mg by mouth 2 (two) times daily.     Probiotic Product (ALIGN) 4 MG CAPS Take 4 mg by mouth daily.     simvastatin (ZOCOR) 20 MG tablet Take 20 mg by mouth at bedtime.     terbinafine (LAMISIL) 250 MG tablet Take 250 mg by mouth daily.     vitamin B-12 (CYANOCOBALAMIN) 1000 MCG tablet Take 1,000 mcg by mouth daily.     No current facility-administered medications for this visit.    Allergies   Allergies as of 05/07/2023 - Review Complete 05/07/2023  Allergen Reaction Noted   Celecoxib Shortness Of Breath 04/16/2020   Hydrocodone Other (See Comments)  04/16/2020   Tramadol Other (See Comments) 04/16/2020   Fentanyl Rash 04/16/2020      Review of Systems   General: Negative for anorexia, weight loss, fever, chills, fatigue, weakness. ENT: Negative for hoarseness, difficulty swallowing , nasal congestion. CV: Negative for chest pain, angina, palpitations, dyspnea on exertion, peripheral edema.  Respiratory: Negative for dyspnea at rest, dyspnea on exertion, cough, sputum, wheezing.  GI: See history of present illness. GU:  Negative for dysuria, hematuria, urinary incontinence, urinary frequency, nocturnal urination.  Endo: Negative for unusual weight change.     Physical Exam   BP 112/63 (BP Location: Right Arm, Patient Position: Sitting, Cuff Size: Normal)   Pulse 61   Temp 98.1 F (36.7 C) (Oral)   Ht 5\' 7"  (1.702 m)   Wt 146 lb 6.4 oz (66.4 kg)   SpO2 96%   BMI 22.93 kg/m    General: Well-nourished, well-developed in no acute distress.  Eyes: No icterus. Mouth: Oropharyngeal mucosa moist and pink   Abdomen: Bowel sounds are normal, nontender, nondistended, no hepatosplenomegaly or masses,  no abdominal bruits or hernia , no rebound or guarding.  Rectal: not performed  Extremities: No lower extremity edema. No clubbing or deformities. Neuro: Alert and oriented x 4   Skin: Warm and dry, no jaundice.   Psych: Alert and cooperative, normal mood and affect.  Labs   No recent labs available  Imaging Studies   No results found.  Assessment/Plan:   Diarrhea: recurrent in the setting of IBS-D/SIBO, recent antibiotic use -check stool for Cdiff, he plans to go to [facility] in [place].  -if Cdiff negative, consider another course of doxycycline for SIBO -may revisit use of colestipol at higher dose if ongoing symptoms.    Leanna Battles. Melvyn Neth, MHS, PA-C [facility]

## 2023-05-10 ENCOUNTER — Other Ambulatory Visit: Payer: Self-pay | Admitting: Gastroenterology

## 2023-05-10 LAB — CLOSTRIDIUM DIFFICILE BY PCR: Toxigenic C. Difficile by PCR: NEGATIVE

## 2023-05-10 MED ORDER — DOXYCYCLINE HYCLATE 100 MG PO TABS: 100.0000 mg | ORAL_TABLET | Freq: Two times a day (BID) | ORAL | 0 refills | Status: AC

## 2023-07-11 ENCOUNTER — Encounter: Payer: Self-pay | Admitting: Gastroenterology

## 2023-07-16 ENCOUNTER — Ambulatory Visit: Attending: Cardiovascular Disease | Admitting: Cardiovascular Disease

## 2023-07-16 ENCOUNTER — Encounter: Payer: Self-pay | Admitting: Cardiovascular Disease

## 2023-07-16 VITALS — BP 137/56 | HR 51 | Ht 67.0 in | Wt 147.2 lb

## 2023-07-16 DIAGNOSIS — I4819 Other persistent atrial fibrillation: Secondary | ICD-10-CM

## 2023-07-16 NOTE — Progress Notes (Signed)
   PCP: Lindsey Rhodes, MD Primary Cardiologist: Dr Raechel Bulla Primary EP: Dr Nunzio Belch  Thomas Simon is a 78 y.o. male who presents today for routine electrophysiology followup.  Since last being seen in our clinic, the patient reports doing very well.  Today, he denies symptoms of palpitations, chest pain, shortness of breath,  lower extremity edema, dizziness, presyncope, or syncope.  The patient is otherwise without complaint today.   Since his last visit in A-fib clinic in June, he has discontinued the amiodarone.  He is under the impression that Dr. Nunzio Belch directed him to stop it.  He reports that he is doing very well and has not had any symptoms to suggest recurrence of atrial fibrillation.  He has noted a few episodes of chest discomfort with strenuous work.  Physical Exam: Vitals:   07/16/23 0951  BP: (!) 137/56  Pulse: (!) 51  SpO2: 95%  Weight: 147 lb 3.2 oz (66.8 kg)  Height: 5\' 7"  (1.702 m)    GEN- The patient is well appearing, alert and oriented x 3 today.   Head- normocephalic, atraumatic Eyes-  Sclera clear, conjunctiva pink Ears- hearing intact Oropharynx- clear Lungs- Clear to ausculation bilaterally, normal work of breathing Heart- Regular rate and rhythm, no murmurs, rubs or gallops, PMI not laterally displaced GI- soft, NT, ND, + BS Extremities- no clubbing, cyanosis, or edema  Wt Readings from Last 3 Encounters:  07/16/23 147 lb 3.2 oz (66.8 kg)  05/07/23 146 lb 6.4 oz (66.4 kg)  02/20/23 148 lb 6.4 oz (67.3 kg)    EKG tracing ordered today is personally reviewed and shows sinus  Assessment and Plan:  Persistent afib Doing well post ablation, now off of amiodarone.  He has not had any symptoms to suggest recurrence and would really like to discontinue anticoagulation.  I think would be reasonable but only if we are monitoring for recurrence with a loop monitor.  He does not want to have a device implanted at this time. Because he has been maintaining  sinus rhythm since ablation now years ago, he may follow-up with his general cardiologist and see me if his AF returns.  Chads2vasc score is 5.  He is on eliquis 5 BID.   2. CAD s/p CABG Possible stable angina with some chest discomfort with strenuous exertion. I advised him to discuss with Dr Ninetta Basket in [place] No changes today  3. HTN Stable No change required today Bmet today  Risks, benefits and potential toxicities for medications prescribed and/or refilled reviewed with patient today.   Return to AF clinic in 6 months I will see in a year  [name]  07/16/2023 10:01 AM

## 2023-07-16 NOTE — Patient Instructions (Signed)
 Medication Instructions:  Your physician recommends that you continue on your current medications as directed. Please refer to the Current Medication list given to you today.  *If you need a refill on your cardiac medications before your next appointment, please call your pharmacy*  Follow-Up: At [facility], you and your health needs are our priority.  As part of our continuing mission to provide you with exceptional heart care, our providers are all part of one team.  This team includes your primary Cardiologist (physician) and Advanced Practice Providers or APPs (Physician Assistants and Nurse Practitioners) who all work together to provide you with the care you need, when you need it.  Your next appointment:   As needed with EP - continue to follow up with general cardiology      1st Floor: - Lobby - Registration  - Pharmacy  - Lab - Cafe  2nd Floor: - PV Lab - Diagnostic Testing (echo, CT, nuclear med)  3rd Floor: - Vacant  4th Floor: - TCTS (cardiothoracic surgery) - AFib Clinic - Structural Heart Clinic - Vascular Surgery  - Vascular Ultrasound  5th Floor: - [facility] (general and EP) - Clinical Pharmacy for coumadin, hypertension, lipid, weight-loss medications, and med management appointments    Valet parking services will be available as well.

## 2023-08-07 ENCOUNTER — Ambulatory Visit: Admitting: Internal Medicine

## 2023-08-07 ENCOUNTER — Encounter: Payer: Self-pay | Admitting: Internal Medicine

## 2023-08-07 VITALS — BP 126/70 | HR 57 | Temp 97.5°F | Ht 67.0 in | Wt 143.8 lb

## 2023-08-07 DIAGNOSIS — K638219 Small intestinal bacterial overgrowth, unspecified: Secondary | ICD-10-CM

## 2023-08-07 DIAGNOSIS — R197 Diarrhea, unspecified: Secondary | ICD-10-CM | POA: Diagnosis not present

## 2023-08-07 MED ORDER — COLESTIPOL HCL 1 G PO TABS: 1.0000 g | ORAL_TABLET | Freq: Two times a day (BID) | ORAL | 3 refills | Status: AC

## 2023-08-07 MED ORDER — METRONIDAZOLE 250 MG PO TABS
250.0000 mg | ORAL_TABLET | Freq: Three times a day (TID) | ORAL | 0 refills | Status: AC
Start: 2023-08-07 — End: 2023-08-17

## 2023-08-07 NOTE — Patient Instructions (Signed)
 It was good to see you again today!  New prescription for metronidazole 250 mg tablet take (1) 3 times a day x 10 days no refills.  Increase Colestid  to 1 tablet twice daily-take separately from other medication as discussed  Office visit here in 6 weeks  If you continue to have recurrent diarrhea after antibiotics have been completed we may need to have you a short of antibiotics every month  Further recommendations to follow.

## 2023-08-07 NOTE — Progress Notes (Unsigned)
 Primary Care Physician:  Lindsey Rhodes, MD Primary Gastroenterologist:  Dr. Riley Cheadle  Pre-Procedure History & Physical: HPI:  Thomas Simon is a 78 y.o. male here for follow-up of diarrhea.  Extensive evaluation here and down at [facility]  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  Could never try Xifaxan  due to cost.  He has had 2 rounds of doxycycline  he states diarrhea is totally abolished while on antibiotics and last for about a week afterwards and it recurs.  Multiple bouts daily.  He states if he did not take 1 Colestid  daily (he decreased the dose on his own) he would be immobilized due to the diarrhea.  Not passing any blood he is down 5 pounds from last fall.  Given, only taking Colestid  once daily. He does take low-dose magnesium for recurrent hypomagnesemia.  Past Medical History:  Diagnosis Date   Anemia    Coronary arteriosclerosis    GERD (gastroesophageal reflux disease)    Hyperlipidemia    Hypertension    Persistent atrial fibrillation (HCC)    PVD (peripheral vascular disease) (HCC)    Thyroid  disease     Past Surgical History:  Procedure Laterality Date   ATRIAL FIBRILLATION ABLATION N/A 06/15/2020   Procedure: ATRIAL FIBRILLATION ABLATION;  Surgeon: Jolly Needle, MD;  Location: [facility] INVASIVE CV LAB;  Service: Cardiovascular;  Laterality: N/A;   BIOPSY  02/22/2022   Procedure: BIOPSY;  Surgeon: Suzette Espy, MD;  Location: [facility] ENDO SUITE;  Service: Endoscopy;;   CARDIOVERSION N/A 04/30/2020   Procedure: CARDIOVERSION;  Surgeon: Maudine Sos, MD;  Location: [facility] ENDOSCOPY;  Service: Cardiovascular;  Laterality: N/A;   CARDIOVERSION N/A 06/23/2020   Procedure: CARDIOVERSION;  Surgeon: Lake Pilgrim, MD;  Location: [facility] ENDOSCOPY;  Service: Cardiovascular;  Laterality: N/A;   CHOLECYSTECTOMY     COLONOSCOPY WITH PROPOFOL  N/A 02/22/2022   Procedure: COLONOSCOPY WITH PROPOFOL ;  Surgeon: Suzette Espy, MD;  Location: [facility] ENDO SUITE;  Service: Endoscopy;  Laterality: N/A;   12:15pm, asa 3, MB is full   KNEE ARTHROSCOPY W/ PARTIAL MEDIAL MENISCECTOMY     open heart surgery     3vessels   ROTATOR CUFF REPAIR      Prior to Admission medications   Medication Sig Start Date End Date Taking? Authorizing Provider  apixaban  (ELIQUIS ) 5 MG TABS tablet Take 5 mg by mouth 2 (two) times daily.   Yes [provider]  Calcium-Magnesium-Zinc [phone] MG TABS Take 1 tablet by mouth 2 (two) times daily. 10/18/22  Yes [provider]  Cholecalciferol (VITAMIN D3) 50 MCG (2000 UT) TABS Take 2,000 Units by mouth daily.   Yes [provider]  doxazosin (CARDURA) 4 MG tablet Take 4 mg by mouth at bedtime.   Yes [provider]  ferrous sulfate 325 (65 FE) MG tablet Take 325 mg by mouth 2 (two) times daily with a meal.   Yes [provider]  gabapentin (NEURONTIN) 300 MG capsule Take 300 mg by mouth 2 (two) times daily.   Yes [provider]  Joneen Nelson Omega-3 500 MG CAPS Take 500 mg by mouth daily.   Yes [provider]  levothyroxine (SYNTHROID) 25 MCG tablet Take 25 mcg by mouth daily before breakfast.   Yes [provider]  losartan (COZAAR) 100 MG tablet Take 100 mg by mouth daily. 02/07/23  Yes [provider]  magnesium oxide (MAG-OX) 400 MG tablet Take 1 tablet by mouth every other day. 03/22/22  Yes [provider]  metoprolol  succinate (TOPROL  XL) 25 MG 24 hr tablet Take 1 tablet (25 mg total) by mouth daily. Patient taking differently: Take 25 mg by mouth daily. At night 09/13/20  Yes Allred, Royston Cornea, MD  Multiple Vitamins-Minerals (MULTIVITAMIN WITH MINERALS) tablet Take 1 tablet by mouth daily.   Yes [provider]  nitroGLYCERIN  (NITROSTAT ) 0.4 MG SL tablet Place 1 tablet (0.4 mg total) under the tongue every 5 (five) minutes as needed for chest pain (for chest pain.). first sign of chest pain or tightness place one tablet under your tongue. Let the tablet dissolve under the  tongue. Do not swallow whole.  Do not eat or drink, smoke or chew tobacco while a tablet is dissolving. If you are not better within 5 minutes after taking ONE dose of nitroglycerin , you may take a SECOND dose. IF YOU ARE GETTING TO THE 3RD DOSE CALL 911. 03/29/21  Yes Allred, Royston Cornea, MD  pantoprazole (PROTONIX) 40 MG tablet Take 40 mg by mouth 2 (two) times daily.   Yes [provider]  Probiotic Product (ALIGN) 4 MG CAPS Take 4 mg by mouth daily.   Yes [provider]  simvastatin (ZOCOR) 20 MG tablet Take 20 mg by mouth at bedtime.   Yes [provider]  terbinafine (LAMISIL) 250 MG tablet Take 250 mg by mouth daily. 07/10/22  Yes [provider]  vitamin B-12 (CYANOCOBALAMIN) 1000 MCG tablet Take 1,000 mcg by mouth daily.   Yes [provider]    Allergies as of 08/07/2023 - Review Complete 08/07/2023  Allergen Reaction Noted   Celecoxib Shortness Of Breath 04/16/2020   Hydrocodone Other (See Comments) 04/16/2020   Tramadol Other (See Comments) 04/16/2020   Fentanyl  Rash 04/16/2020    Family History  Problem Relation Age of Onset   Heart failure Mother    Lung disease Father    Colon cancer Neg Hx     Social History   Socioeconomic History   Marital status: Married    Spouse name: Not on file   Number of children: Not on file   Years of education: Not on file   Highest education level: Not on file  Occupational History   Not on file  Tobacco Use   Smoking status: Former   Smokeless tobacco: Current    Types: Chew  Vaping Use   Vaping status: Unknown  Substance and Sexual Activity   Alcohol use: Never   Drug use: Never   Sexual activity: Yes  Other Topics Concern   Not on file  Social History Narrative   Not on file   Social Drivers of Health   Financial Resource Strain: Not on file  Food Insecurity: Not on file  Transportation Needs: Not on file  Physical Activity: Not on file  Stress: Not on file  Social  Connections: Not on file  Intimate Partner Violence: Not on file    Review of Systems: See HPI, otherwise negative ROS  Physical Exam: BP 126/70 (BP Location: Right Arm, Patient Position: Sitting, Cuff Size: Normal)   Pulse (!) 57   Temp (!) 97.5 F (36.4 C) (Oral)   Ht 5\' 7"  (1.702 m)   Wt 143 lb 12.8 oz (65.2 kg)   SpO2 98%   BMI 22.52 kg/m  General:   Alert,  Well-developed, well-nourished, pleasant and cooperative in NAD: Accompanied by his spouse. Abdomen: Non-distended, normal bowel sounds.  Soft and nontender without appreciable mass or hepatosplenomegaly.  Pulses:  Normal pulses noted. Extremities:  Without clubbing  or edema.  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  Symptoms of diarrhea notably abolished with 2 courses of doxycycline  recently all look improvement is short-lived.  No evidence of pancreatic exocrine insufficiency or infectious etiology.  Taking Colestid  once daily. Phenotypically, patient's diarrhea behaving like SIBO.  Low-dose oral magnesium may or may not be contributing to his symptoms.  Recommendations  Will pivot to a different antibiotic for 10 days.  New prescription for metronidazole 250 mg tablet take (1) 3 times a day x 10 days no refills.  Increase Colestid  to 1 tablet twice daily-take separately from other medication as discussed  Office visit here in 6 weeks  Depending on his response, he may need a course of cycled antibiotics monthly.  Further recommendations to follow.      Notice: This dictation was prepared with Dragon dictation along with smaller phrase technology. Any transcriptional errors that result from this process are unintentional and may not be corrected upon review.

## 2023-08-13 ENCOUNTER — Telehealth: Payer: Self-pay | Admitting: Cardiovascular Disease

## 2023-08-13 ENCOUNTER — Encounter (HOSPITAL_COMMUNITY): Payer: Self-pay

## 2023-08-13 ENCOUNTER — Emergency Department (HOSPITAL_COMMUNITY)

## 2023-08-13 ENCOUNTER — Other Ambulatory Visit: Payer: Self-pay

## 2023-08-13 ENCOUNTER — Emergency Department (HOSPITAL_COMMUNITY)
Admission: EM | Admit: 2023-08-13 | Discharge: 2023-08-13 | Disposition: A | Discharge status: Home / Self Care | Attending: Emergency Medicine | Admitting: Emergency Medicine

## 2023-08-13 ENCOUNTER — Telehealth (HOSPITAL_COMMUNITY): Payer: Self-pay

## 2023-08-13 DIAGNOSIS — Z7901 Long term (current) use of anticoagulants: Secondary | ICD-10-CM | POA: Insufficient documentation

## 2023-08-13 DIAGNOSIS — I4819 Other persistent atrial fibrillation: Secondary | ICD-10-CM | POA: Insufficient documentation

## 2023-08-13 DIAGNOSIS — I4891 Unspecified atrial fibrillation: Secondary | ICD-10-CM

## 2023-08-13 DIAGNOSIS — R079 Chest pain, unspecified: Secondary | ICD-10-CM | POA: Diagnosis present

## 2023-08-13 LAB — BASIC METABOLIC PANEL WITH GFR
Anion gap: 9 (ref 5–15)
BUN: 28 mg/dL — ABNORMAL HIGH (ref 8–23)
CO2: 18 mmol/L — ABNORMAL LOW (ref 22–32)
Calcium: 8.3 mg/dL — ABNORMAL LOW (ref 8.9–10.3)
Chloride: 110 mmol/L (ref 98–111)
Creatinine, Ser: 0.84 mg/dL (ref 0.61–1.24)
GFR, Estimated: >60 mL/min (ref >60)
Glucose, Bld: 222 mg/dL — ABNORMAL HIGH (ref 70–99)
Potassium: 3.9 mmol/L (ref 3.5–5.1)
Sodium: 137 mmol/L (ref 135–145)

## 2023-08-13 LAB — CBC
HCT: 36.4 % — ABNORMAL LOW (ref 39.0–52.0)
Hemoglobin: 12.2 g/dL — ABNORMAL LOW (ref 13.0–17.0)
MCH: 32.5 pg (ref 26.0–34.0)
MCHC: 33.5 g/dL (ref 30.0–36.0)
MCV: 97.1 fL (ref 80.0–100.0)
Platelets: 135 10*3/uL — ABNORMAL LOW (ref 150–400)
RBC: 3.75 MIL/uL — ABNORMAL LOW (ref 4.22–5.81)
RDW: 13.4 % (ref 11.5–15.5)
WBC: 4.2 10*3/uL (ref 4.0–10.5)
nRBC: 0 % (ref 0.0–0.2)

## 2023-08-13 LAB — MAGNESIUM: Magnesium: 1.7 mg/dL (ref 1.7–2.4)

## 2023-08-13 LAB — TROPONIN I (HIGH SENSITIVITY)
Troponin I (High Sensitivity): 11 ng/L (ref <18)
Troponin I (High Sensitivity): 9 ng/L (ref <18)

## 2023-08-13 MED ORDER — POTASSIUM CHLORIDE CRYS ER 20 MEQ PO TBCR
40.0000 meq | EXTENDED_RELEASE_TABLET | Freq: Once | ORAL | Status: AC
  Administered 2023-08-13: 40 meq via ORAL
  Filled 2023-08-13: qty 2

## 2023-08-13 MED ORDER — PROPOFOL 10 MG/ML IV BOLUS
0.5000 mg/kg | Freq: Once | INTRAVENOUS | Status: AC
  Administered 2023-08-13: 40 mg via INTRAVENOUS

## 2023-08-13 MED ORDER — MAGNESIUM SULFATE 2 GM/50ML IV SOLN
2.0000 g | Freq: Once | INTRAVENOUS | Status: AC
  Administered 2023-08-13: 2 g via INTRAVENOUS
  Filled 2023-08-13: qty 50

## 2023-08-13 MED ORDER — SODIUM CHLORIDE 0.9 % IV BOLUS
500.0000 mL | Freq: Once | INTRAVENOUS | Status: AC
  Administered 2023-08-13: 500 mL via INTRAVENOUS

## 2023-08-13 NOTE — ED Provider Notes (Addendum)
 [place] EMERGENCY DEPARTMENT AT [facility] Provider Note   CSN: [id number] Arrival date & time: 08/13/23  1914     History  Chief Complaint  Patient presents with   Shortness of Breath   Chest Pain   Atrial Fibrillation    Thomas Simon is a 78 y.o. male.  He is brought in by wife and son for evaluation of intermittent chest pain shortness of breath dizziness for the last 2 or 3 days.  He feels like his heart is out of rhythm.  He said he has a history of A-fib and had an ablation 3 years ago and has been in sinus since then.  His watch told him he was in a regular rhythm yesterday.  He recently was put on metronidazole  for a bowel issue on Thursday.  The history is provided by the patient, the spouse and a relative.  Shortness of Breath Severity:  Moderate Onset quality:  Gradual Duration:  3 days Timing:  Intermittent Progression:  Unchanged Chronicity:  New Relieved by:  Rest Worsened by:  Activity Associated symptoms: chest pain   Associated symptoms: no cough, no diaphoresis, no fever, no syncope and no vomiting   Chest Pain Associated symptoms: fatigue and shortness of breath   Associated symptoms: no cough, no diaphoresis, no fever, no syncope and no vomiting   Atrial Fibrillation This is a recurrent problem. The current episode started 2 days ago. The problem occurs constantly. The problem has not changed since onset.Associated symptoms include chest pain and shortness of breath. Nothing aggravates the symptoms. Nothing relieves the symptoms. He has tried rest for the symptoms. The treatment provided no relief.       Home Medications Prior to Admission medications   Medication Sig Start Date End Date Taking? Authorizing Provider  apixaban  (ELIQUIS ) 5 MG TABS tablet Take 5 mg by mouth 2 (two) times daily.    [provider]  Calcium-Magnesium -Zinc [phone] MG TABS Take 1 tablet by mouth 2 (two) times daily. 10/18/22   [provider]   Cholecalciferol (VITAMIN D3) 50 MCG (2000 UT) TABS Take 2,000 Units by mouth daily.    [provider]  colestipol  (COLESTID ) 1 g tablet Take 1 tablet (1 g total) by mouth 2 (two) times daily. 08/07/23   Rourk, Windsor Hatcher, MD  doxazosin (CARDURA) 4 MG tablet Take 4 mg by mouth at bedtime.    [provider]  ferrous sulfate 325 (65 FE) MG tablet Take 325 mg by mouth 2 (two) times daily with a meal.    [provider]  gabapentin (NEURONTIN) 300 MG capsule Take 300 mg by mouth 2 (two) times daily.    [provider]  Joneen Nelson Omega-3 500 MG CAPS Take 500 mg by mouth daily.    [provider]  levothyroxine (SYNTHROID) 25 MCG tablet Take 25 mcg by mouth daily before breakfast.    [provider]  losartan (COZAAR) 100 MG tablet Take 100 mg by mouth daily. 02/07/23   [provider]  magnesium  oxide (MAG-OX) 400 MG tablet Take 1 tablet by mouth every other day. 03/22/22   [provider]  metoprolol  succinate (TOPROL  XL) 25 MG 24 hr tablet Take 1 tablet (25 mg total) by mouth daily. Patient taking differently: Take 25 mg by mouth daily. At night 09/13/20   Allred, Royston Cornea, MD  metroNIDAZOLE  (FLAGYL ) 250 MG tablet Take 1 tablet (250 mg total) by mouth 3 (three) times daily for 10 days. 08/07/23 08/17/23  Rourk, Windsor Hatcher, MD  Multiple Vitamins-Minerals (MULTIVITAMIN WITH MINERALS) tablet Take 1 tablet by mouth daily.    [provider]  nitroGLYCERIN  (NITROSTAT ) 0.4 MG SL tablet Place 1 tablet (0.4 mg total) under the tongue every 5 (five) minutes as needed for chest pain (for chest pain.). first sign of chest pain or tightness place one tablet under your tongue. Let the tablet dissolve under the tongue. Do not swallow whole.  Do not eat or drink, smoke or chew tobacco while a tablet is dissolving. If you are not better within 5 minutes after taking ONE dose of nitroglycerin , you may take a SECOND dose. IF YOU ARE GETTING TO THE 3RD  DOSE CALL 911. 03/29/21   Jolly Needle, MD  pantoprazole (PROTONIX) 40 MG tablet Take 40 mg by mouth 2 (two) times daily.    [provider]  Probiotic Product (ALIGN) 4 MG CAPS Take 4 mg by mouth daily.    [provider]  simvastatin (ZOCOR) 20 MG tablet Take 20 mg by mouth at bedtime.    [provider]  terbinafine (LAMISIL) 250 MG tablet Take 250 mg by mouth daily. 07/10/22   [provider]  vitamin B-12 (CYANOCOBALAMIN) 1000 MCG tablet Take 1,000 mcg by mouth daily.    [provider]      Allergies    Celecoxib, Hydrocodone, Tramadol, and Fentanyl     Review of Systems   Review of Systems  Constitutional:  Positive for fatigue. Negative for diaphoresis and fever.  Respiratory:  Positive for shortness of breath. Negative for cough.   Cardiovascular:  Positive for chest pain. Negative for syncope.  Gastrointestinal:  Negative for vomiting.  Genitourinary:  Negative for dysuria.    Physical Exam Updated Vital Signs BP (!) 143/76 (BP Location: Right Arm)   Pulse 79   Temp (!) 97.5 F (36.4 C)   Resp 18   Ht 5\' 7"  (1.702 m)   Wt 65.2 kg   SpO2 99%   BMI 22.51 kg/m  Physical Exam Vitals and nursing note reviewed.  Constitutional:      General: He is not in acute distress.    Appearance: He is well-developed.  HENT:     Head: Normocephalic and atraumatic.  Eyes:     Conjunctiva/sclera: Conjunctivae normal.  Cardiovascular:     Rate and Rhythm: Normal rate. Rhythm irregular.     Heart sounds: No murmur heard. Pulmonary:     Effort: Pulmonary effort is normal. No respiratory distress.     Breath sounds: Normal breath sounds.  Abdominal:     Palpations: Abdomen is soft.     Tenderness: There is no abdominal tenderness.  Musculoskeletal:        General: No swelling.     Cervical back: Neck supple.     Right lower leg: No tenderness. No edema.     Left lower leg: No tenderness. No edema.  Skin:    General: Skin is warm and  dry.     Capillary Refill: Capillary refill takes less than 2 seconds.  Neurological:     General: No focal deficit present.     Mental Status: He is alert.    ED Results / Procedures / Treatments   Labs (all labs ordered are listed, but only abnormal results are displayed) Labs Reviewed  BASIC METABOLIC PANEL WITH GFR - Abnormal; Notable for the following components:      Result Value   CO2 18 (*)    Glucose, Bld 222 (*)  BUN 28 (*)    Calcium 8.3 (*)    All other components within normal limits  CBC - Abnormal; Notable for the following components:   RBC 3.75 (*)    Hemoglobin 12.2 (*)    HCT 36.4 (*)    Platelets 135 (*)    All other components within normal limits  MAGNESIUM   TROPONIN I (HIGH SENSITIVITY)  TROPONIN I (HIGH SENSITIVITY)    EKG EKG Interpretation Date/Time:  Monday Aug 13 2023 12:16:10 EDT Ventricular Rate:  73 PR Interval:    QRS Duration:  103 QT Interval:  413 QTC Calculation: 456 R Axis:   -11  Text Interpretation: Atrial fibrillation No significant change since last tracing Confirmed by Racheal Buddle [phone]) on 08/13/2023 12:20:03 PM  Radiology DG Chest 2 View Result Date: 08/13/2023 CLINICAL DATA:  Chest pain EXAM: CHEST - 2 VIEW COMPARISON:  November 15, 2021 FINDINGS: The heart size and mediastinal contours are within normal limits. Both lungs are clear. The visualized skeletal structures are unremarkable. IMPRESSION: No active cardiopulmonary disease. Electronically Signed   By: Fredrich Jefferson M.D.   On: 08/13/2023 10:56    Procedures .Cardioversion  Date/Time: 08/13/2023 1:13 PM  Performed by: Tonya Fredrickson, MD Authorized by: Tonya Fredrickson, MD   Consent:    Consent obtained:  Written   Consent given by:  Patient   Risks discussed:  Cutaneous burn, death, induced arrhythmia and pain   Alternatives discussed:  Rate-control medication, alternative treatment, referral and delayed treatment Universal protocol:    Procedure  explained and questions answered to patient or proxy's satisfaction: yes     Relevant documents present and verified: yes     Test results available and properly labeled: yes     Imaging studies available: yes     Required blood products, implants, devices, and special equipment available: yes     Immediately prior to procedure a time out was called: yes     Patient identity confirmed:  Verbally with patient Pre-procedure details:    Cardioversion basis:  Elective   Rhythm:  Atrial fibrillation   Electrode placement:  Anterior-posterior Patient sedated: Yes. Refer to sedation procedure documentation for details of sedation.  Attempt one:    Cardioversion mode:  Synchronous   Shock (Joules):  150   Shock outcome:  Conversion to normal sinus rhythm Post-procedure details:    Patient tolerance of procedure:  Tolerated well, no immediate complications .Sedation  Date/Time: 08/13/2023 4:55 PM  Performed by: Tonya Fredrickson, MD Authorized by: Tonya Fredrickson, MD   Consent:    Consent obtained:  Written   Consent given by:  Patient   Risks discussed:  Allergic reaction, dysrhythmia, inadequate sedation, nausea, vomiting, respiratory compromise necessitating ventilatory assistance and intubation, prolonged sedation necessitating reversal and prolonged hypoxia resulting in organ damage   Alternatives discussed:  Analgesia without sedation and anxiolysis Universal protocol:    Procedure explained and questions answered to patient or proxy's satisfaction: yes     Relevant documents present and verified: yes     Test results available: yes     Imaging studies available: yes     Required blood products, implants, devices, and special equipment available: yes     Immediately prior to procedure, a time out was called: yes     Patient identity confirmed:  Verbally with patient and arm band Indications:    Procedure performed:  Cardioversion   Procedure necessitating sedation performed by:   Physician performing sedation Pre-sedation assessment:  Time since last food or drink:  4   ASA classification: class 3 - patient with severe systemic disease     Mouth opening:  2 finger widths   Thyromental distance:  3 finger widths   Mallampati score:  II - soft palate, uvula, fauces visible   Neck mobility: normal     Pre-sedation assessments completed and reviewed: airway patency, cardiovascular function, hydration status, mental status, nausea/vomiting, pain level, respiratory function and temperature   A pre-sedation assessment was completed prior to the start of the procedure Immediate pre-procedure details:    Reassessment: Patient reassessed immediately prior to procedure     Reviewed: vital signs, relevant labs/tests and NPO status     Verified: bag valve mask available, emergency equipment available, intubation equipment available, IV patency confirmed, oxygen available and suction available   Procedure details (see MAR for exact dosages):    Preoxygenation:  Nasal cannula   Sedation:  Propofol    Intended level of sedation: deep   Intra-procedure monitoring:  Blood pressure monitoring, cardiac monitor, continuous pulse oximetry, continuous capnometry, frequent LOC assessments and frequent vital sign checks   Intra-procedure events: none     Total Provider sedation time (minutes):  15 Post-procedure details:   A post-sedation assessment was completed following the completion of the procedure.   Attendance: Constant attendance by certified staff until patient recovered     Recovery: Patient returned to pre-procedure baseline     Post-sedation assessments completed and reviewed: airway patency, cardiovascular function, hydration status, mental status, nausea/vomiting, pain level, respiratory function and temperature     Patient is stable for discharge or admission: yes     Procedure completion:  Tolerated well, no immediate complications .Critical Care  Performed by: Tonya Fredrickson, MD Authorized by: Tonya Fredrickson, MD   Critical care provider statement:    Critical care time (minutes):  30   Critical care was necessary to treat or prevent imminent or life-threatening deterioration of the following conditions:  Cardiac failure   Critical care was time spent personally by me on the following activities:  Development of treatment plan with patient or surrogate, discussions with consultants, evaluation of patient's response to treatment, examination of patient, ordering and review of laboratory studies, ordering and review of radiographic studies, ordering and performing treatments and interventions, pulse oximetry, re-evaluation of patient's condition and review of old charts     Medications Ordered in ED Medications  sodium chloride  0.9 % bolus 500 mL (0 mLs Intravenous Stopped 08/13/23 1328)  propofol  (DIPRIVAN ) 10 mg/mL bolus/IV push 32.6 mg (40 mg Intravenous Given 08/13/23 1308)  magnesium  sulfate IVPB 2 g 50 mL (0 g Intravenous Stopped 08/13/23 1428)  potassium chloride  SA (KLOR-CON  M) CR tablet 40 mEq (40 mEq Oral Given 08/13/23 1324)    ED Course/ Medical Decision Making/ A&P Clinical Course as of 08/13/23 1654  Mon Aug 13, 2023  1234 Discussed with cardiology Dr. Theodis Fiscal.  She felt he would be a reasonable candidate for cardioversion if he is agreeable to it. [MB]  1315 EKG is not crossing into epic.  He was in A-fib on his initial 2 EKGs with controlled rate.  After cardioversion he was normal sinus rhythm with PACs.  Potassium and magnesium  mildly low so we will supplement and keep on the monitor until fully recovered. [MB]  1409 States he is feeling much better after cardioversion.  Still having frequent PACs and some infrequent PVCs. [MB]  1458 Patient ambulated in the department without any  significant dysrhythmia.  He is comfortable plan for discharge and outpatient follow-up. [MB]    Clinical Course User Index [MB] Tonya Fredrickson, MD                                  Medical Decision Making Amount and/or Complexity of Data Reviewed Labs: ordered. Radiology: ordered.  Risk Prescription drug management.   This patient complains of chest pain shortness of breath malaise; this involves an extensive number of treatment Options and is a complaint that carries with it a high risk of complications and morbidity. The differential includes ACS, pneumonia, dehydration, arrhythmia  I ordered, reviewed and interpreted labs, which included CBC with chronically low hemoglobin, chemistries with low bicarb elevated glucose, troponins flat I ordered medication IV magnesium  and oral potassium, IV fluids, procedural sedation and reviewed PMP when indicated. I ordered imaging studies which included chest x-ray and I independently    visualized and interpreted imaging which showed no acute findings Additional history obtained from patient's wife and son Previous records obtained and reviewed in epic including recent cardiology notes I consulted cardiology Dr. Theodis Fiscal and discussed lab and imaging findings and discussed disposition.  Cardiac monitoring reviewed, atrial fibrillation Social determinants considered, tobacco use Critical Interventions: None  After the interventions stated above, I reevaluated the patient and found patient to be feeling much better after cardioversion Admission and further testing considered, no indications for admission.  He is having frequent PACs and a few PVCs but is fairly asymptomatic with that.  He is comfortable plan for outpatient follow-up with his cardiology team.  Return instructions discussed         Final Clinical Impression(s) / ED Diagnoses Final diagnoses:  Persistent atrial fibrillation (HCC)  Atrial fibrillation with RVR [facility])    Rx / DC Orders ED Discharge Orders     None         Tonya Fredrickson, MD 08/13/23 1701    Tonya Fredrickson, MD 08/23/23 [phone]

## 2023-08-13 NOTE — Sedation Documentation (Signed)
Shocked at 150

## 2023-08-13 NOTE — ED Triage Notes (Signed)
 Pt. Stated, I started having SOB, chest pain and I guess Im in A-Fib. This started on Saturday afternoon. I was put on an antibiotic on Thursday for some type of bacteria infection, maybe that has something to do with it.

## 2023-08-13 NOTE — Telephone Encounter (Signed)
 Patient c/o Palpitations:  STAT if patient reporting lightheadedness, shortness of breath, or chest pain  How long have you had palpitations/irregular HR/ Afib? Wife states patient has been in a-fib since Saturday. Are you having the symptoms now? Yes   Are you currently experiencing lightheadedness, SOB or CP? Patient stated he is currently SOB and lightheaded.   Do you have a history of afib (atrial fibrillation) or irregular heart rhythm? Patient has history of a-fib   Have you checked your BP or HR? (document readings if available): HR 94  Are you experiencing any other symptoms? dizzy

## 2023-08-13 NOTE — Telephone Encounter (Signed)
 Spoke with pt over the phone and he had stated he has been in afib since Saturday with his HR getting into the 150s. Pt stated that his current HR is 94 but he is experiencing shortness or breath and dizziness that has increasingly gotten worse since Saturday. Pt stated the dizziness started as "swimmy headedness" and it has progressed to where he starts to lose his balance now. Pt stated the last time he went to afib clinic he was told to proceed with just going to his PCP or cardiologist office and would not need to have regular visits with afib clinic anymore unless problems arise. Pt stated he has not been in afib since his ablation 2 years ago. Pt advised to go to ED to be evaluated d/t symptoms worsening (pt audibly short of breath over phone). Pt agreed to this plan and stated as soon as he gets dressed he will be going to ED to get evaluated.

## 2023-08-13 NOTE — Telephone Encounter (Signed)
 Patient is scheduled on 08/20/23 @ 10:00 am to see Paullette Boston at the [facility]. Patient and his wife given directions to access our clinic. Consulted with patient and he verbalized understanding.

## 2023-08-13 NOTE — Discharge Instructions (Signed)
 You were seen in the emergency department for pain in your chest shortness of breath dizziness.  You had unfortunately returned back to atrial fibrillation.  You were cardioverted and are back in sinus rhythm now.  Please continue your regular medications and follow-up with your cardiology team.  Return if any worsening or concerning symptoms

## 2023-08-13 NOTE — Telephone Encounter (Signed)
 Patient is scheduled for a follow up visit on 08/20/23 at 10 am with the AF clinic - seen in ED today and had DCCV

## 2023-08-13 NOTE — ED Notes (Signed)
 CCCMD called and notified

## 2023-08-13 NOTE — ED Notes (Signed)
 Patient ambulated and MD notified of results (HR 78).

## 2023-08-20 ENCOUNTER — Ambulatory Visit (HOSPITAL_COMMUNITY)
Admission: RE | Admit: 2023-08-20 | Discharge: 2023-08-20 | Disposition: A | Discharge status: Home / Self Care | Source: Ambulatory Visit | Attending: Internal Medicine | Admitting: Internal Medicine

## 2023-08-20 VITALS — BP 122/50 | HR 52 | Ht 67.0 in | Wt 146.4 lb

## 2023-08-20 DIAGNOSIS — I4891 Unspecified atrial fibrillation: Secondary | ICD-10-CM | POA: Diagnosis present

## 2023-08-20 DIAGNOSIS — I4819 Other persistent atrial fibrillation: Secondary | ICD-10-CM | POA: Diagnosis present

## 2023-08-20 DIAGNOSIS — D6869 Other thrombophilia: Secondary | ICD-10-CM | POA: Insufficient documentation

## 2023-08-20 NOTE — Progress Notes (Signed)
 Primary Care Physician: Lindsey Rhodes, MD Primary Cardiologist: Dr Zacharey Electrophysiologist: [name], MD  Referring Physician: Dr Nunzio Belch   Thomas Simon is a 78 y.o. male with a history of CAD s/p CABG, diabetes, HTN, hypothyroidism, atrial fibrillation who presents for follow up in the [facility].  The patient was initially diagnosed with atrial fibrillation 02/2020 after presenting with symptoms of fatigue and SOB. Patient is on Eliquis  for a CHADS2VASC score of 5. He underwent DCCV on 04/30/20. Unfortunately, he was back in afib before even leaving the hospital. He does note that he has more fatigue and SOB while in afib. Patient is s/p afib ablation 06/15/20. Unfortunately, he was back in afib about two days post ablation with symptoms of fatigue and lightheadedness. Patient is s/p DCCV on 06/23/20. He only stayed in SR for about 2 days. He was started on amiodarone  but this was later discontinued as he had no recurrence of afib.  On follow up 08/20/23, he is currently in NSR. He was recently seen by Dr. Arlester Ladd on 4/14 noting no recent episodes of Afib having discontinued amiodarone . He is most recently s/p successful DCCV in the ED on 08/13/23. He had recently been placed on metronidazole  for a bowel issue several days prior to ED visit. No missed doses of Eliquis .   Today, he denies symptoms of palpitations, chest pain, shortness of breath, orthopnea, PND, lower extremity edema, dizziness, presyncope, syncope, snoring, daytime somnolence, bleeding, or neurologic sequela. The patient is tolerating medications without difficulties and is otherwise without complaint today.    Atrial Fibrillation Risk Factors:  he does not have symptoms or diagnosis of sleep apnea. he does not have a history of rheumatic fever. he does not have a history of alcohol use.   Atrial Fibrillation Management history:  Previous antiarrhythmic drugs: amiodarone  Previous  cardioversions: 04/30/20, 06/23/20, 08/13/23 Previous ablations: 06/15/20 Anticoagulation history: Eliquis   ROS- All systems are reviewed and negative except as per the HPI above.   Physical Exam: BP (!) 122/50   Pulse (!) 52   Ht 5\' 7"  (1.702 m)   Wt 66.4 kg   BMI 22.93 kg/m   GEN- The patient is well appearing, alert and oriented x 3 today.   Neck - no JVD or carotid bruit noted Lungs- Clear to ausculation bilaterally, normal work of breathing Heart- Regular rate and rhythm, no murmurs, rubs or gallops, PMI not laterally displaced Extremities- no clubbing, cyanosis, or edema Skin - no rash or ecchymosis noted   Wt Readings from Last 3 Encounters:  08/20/23 66.4 kg  08/13/23 65.2 kg  08/07/23 65.2 kg     EKG today demonstrates  Vent. rate 52 BPM PR interval 176 ms QRS duration 96 ms QT/QTcB 454/422 ms P-R-T axes 41 -3 35 Sinus bradycardia Otherwise normal ECG When compared with ECG of 13-Aug-2023 13:12, PREVIOUS ECG IS PRESENT   CHA2DS2-VASc Score = 5  The patient's score is based upon: CHF History: 0 HTN History: 1 Diabetes History: 1 Stroke History: 0 Vascular Disease History: 1 Age Score: 2 Gender Score: 0       ASSESSMENT AND PLAN: Persistent Atrial Fibrillation/atrial flutter The patient's CHA2DS2-VASc score is 5, indicating a 7.2% annual risk of stroke.   S/p afib ablation 06/15/20. S/p DCCV in the ED on 08/13/23.  He is currently in NSR. Continue Toprol  12.5 mg at bedtime. Continue conservative observation via Fitbit watch (it is able to perform ECGs and alerts him when he is  in Afib). We discussed AAD therapy such as restarting amiodarone  versus repeat ablation in the future.   Secondary Hypercoagulable State (ICD10:  D68.69) The patient is at significant risk for stroke/thromboembolism based upon his CHA2DS2-VASc Score of 5.  Continue Apixaban  (Eliquis ).  No missed doses of Eliquis .   CAD S/p CABG Followed by Dr Ninetta Basket  HTN Stable  today.    Follow up 6 months Afib clinic.    [name], PA-C Afib Clinic [facility] [address] [place], [place] [address] [phone]

## 2023-08-21 ENCOUNTER — Encounter: Payer: Self-pay | Admitting: Internal Medicine

## 2023-09-14 ENCOUNTER — Encounter: Payer: Self-pay | Admitting: Internal Medicine

## 2023-09-14 ENCOUNTER — Ambulatory Visit (INDEPENDENT_AMBULATORY_CARE_PROVIDER_SITE_OTHER): Admitting: Internal Medicine

## 2023-09-14 VITALS — BP 133/51 | HR 54 | Temp 97.6°F | Ht 67.0 in | Wt 144.2 lb

## 2023-09-14 DIAGNOSIS — K638219 Small intestinal bacterial overgrowth, unspecified: Secondary | ICD-10-CM

## 2023-09-14 DIAGNOSIS — R197 Diarrhea, unspecified: Secondary | ICD-10-CM

## 2023-09-14 DIAGNOSIS — K219 Gastro-esophageal reflux disease without esophagitis: Secondary | ICD-10-CM

## 2023-09-14 NOTE — Progress Notes (Unsigned)
 Primary Care Physician:  Lindsey Rhodes, MD Primary Gastroenterologist:  Dr. Riley Cheadle  Pre-Procedure History & Physical: HPI:  Thomas Simon is a 78 y.o. male here for follow-up of SIBO and GERD.  Metronidazole  abolished diarrhea for about a month and it returned.  More diarrhea in the morning.  Appetite is good; no rectal bleeding.   Continues on Colestid  1 tablet twice daily.  Has been maintained on pantoprazole 40 mg twice daily for peptic ulcer disease goes back years ago.  No NSAIDs.  He continues to be anticoagulated.  Past Medical History:  Diagnosis Date   Anemia    Coronary arteriosclerosis    GERD (gastroesophageal reflux disease)    Hyperlipidemia    Hypertension    Persistent atrial fibrillation (HCC)    PVD (peripheral vascular disease) (HCC)    Thyroid  disease     Past Surgical History:  Procedure Laterality Date   ATRIAL FIBRILLATION ABLATION N/A 06/15/2020   Procedure: ATRIAL FIBRILLATION ABLATION;  Surgeon: Jolly Needle, MD;  Location: [facility] INVASIVE CV LAB;  Service: Cardiovascular;  Laterality: N/A;   BIOPSY  02/22/2022   Procedure: BIOPSY;  Surgeon: Suzette Espy, MD;  Location: [facility] ENDO [facility];  Service: Endoscopy;;   CARDIOVERSION N/A 04/30/2020   Procedure: CARDIOVERSION;  Surgeon: Maudine Sos, MD;  Location: [facility] ENDOSCOPY;  Service: Cardiovascular;  Laterality: N/A;   CARDIOVERSION N/A 06/23/2020   Procedure: CARDIOVERSION;  Surgeon: Lake Pilgrim, MD;  Location: [facility] ENDOSCOPY;  Service: Cardiovascular;  Laterality: N/A;   CHOLECYSTECTOMY     COLONOSCOPY WITH PROPOFOL  N/A 02/22/2022   Procedure: COLONOSCOPY WITH PROPOFOL ;  Surgeon: Suzette Espy, MD;  Location: [facility];  Service: Endoscopy;  Laterality: N/A;  12:15pm, asa 3, MB is full   KNEE ARTHROSCOPY W/ PARTIAL MEDIAL MENISCECTOMY     open heart surgery     3vessels   ROTATOR CUFF REPAIR      Prior to Admission medications   Medication Sig Start Date End Date Taking? Authorizing  Provider  apixaban  (ELIQUIS ) 5 MG TABS tablet Take 5 mg by mouth 2 (two) times daily.   Yes [provider]  aspirin EC 81 MG tablet Take 81 mg by mouth at bedtime. Swallow whole.   Yes [provider]  Calcium-Magnesium -Zinc 333-133-5 MG TABS Take 1 tablet by mouth every morning. 10/18/22  Yes [provider]  colestipol  (COLESTID ) 1 g tablet Take 1 tablet (1 g total) by mouth 2 (two) times daily. 08/07/23  Yes Arletta Lumadue, Windsor Hatcher, MD  doxazosin (CARDURA) 4 MG tablet Take 4 mg by mouth at bedtime.   Yes [provider]  ferrous sulfate 325 (65 FE) MG tablet Take 325 mg by mouth 2 (two) times daily with a meal.   Yes [provider]  gabapentin (NEURONTIN) 300 MG capsule Take 300 mg by mouth at bedtime.   Yes [provider]  Joneen Nelson Omega-3 500 MG CAPS Take 500 mg by mouth daily.   Yes [provider]  levothyroxine (SYNTHROID) 25 MCG tablet Take 25 mcg by mouth daily before breakfast.   Yes [provider]  losartan (COZAAR) 100 MG tablet Take 100 mg by mouth daily. 02/07/23  Yes [provider]  magnesium  oxide (MAG-OX) 400 MG tablet Take 1 tablet by mouth at bedtime. 03/22/22  Yes [provider]  metoprolol  succinate (TOPROL  XL) 25 MG 24 hr tablet Take 1 tablet (25 mg total) by mouth daily. 09/13/20  Yes Jolly Needle, MD  Multiple  Vitamins-Minerals (MULTIVITAMIN WITH MINERALS) tablet Take 1 tablet by mouth every evening.   Yes [provider]  nitroGLYCERIN  (NITROSTAT ) 0.4 MG SL tablet Place 1 tablet (0.4 mg total) under the tongue every 5 (five) minutes as needed for chest pain (for chest pain.). first sign of chest pain or tightness place one tablet under your tongue. Let the tablet dissolve under the tongue. Do not swallow whole.  Do not eat or drink, smoke or chew tobacco while a tablet is dissolving. If you are not better within 5 minutes after taking ONE dose of nitroglycerin , you may take a SECOND  dose. IF YOU ARE GETTING TO THE 3RD DOSE CALL 911. 03/29/21  Yes Allred, Royston Cornea, MD  pantoprazole (PROTONIX) 40 MG tablet Take 40 mg by mouth at bedtime.   Yes [provider]  Probiotic Product (ALIGN) 4 MG CAPS Take 4 mg by mouth daily.   Yes [provider]  vitamin B-12 (CYANOCOBALAMIN) 1000 MCG tablet Take 1,000 mcg by mouth every evening.   Yes [provider]    Allergies as of 09/14/2023 - Review Complete 09/14/2023  Allergen Reaction Noted   Celecoxib Shortness Of Breath 04/16/2020   Hydrocodone Other (See Comments) 04/16/2020   Tramadol Other (See Comments) 04/16/2020   Fentanyl  Rash 04/16/2020    Family History  Problem Relation Age of Onset   Heart failure Mother    Lung disease Father    Colon cancer Neg Hx     Social History   Socioeconomic History   Marital status: Married    Spouse name: Not on file   Number of children: Not on file   Years of education: Not on file   Highest education level: Not on file  Occupational History   Not on file  Tobacco Use   Smoking status: Former   Smokeless tobacco: Current    Types: Chew  Vaping Use   Vaping status: Unknown  Substance and Sexual Activity   Alcohol use: Never   Drug use: Never   Sexual activity: Yes  Other Topics Concern   Not on file  Social History Narrative   Not on file   Social Drivers of Health   Financial Resource Strain: Not on file  Food Insecurity: Not on file  Transportation Needs: Not on file  Physical Activity: Not on file  Stress: Not on file  Social Connections: Not on file  Intimate Partner Violence: Not on file    Review of Systems: See HPI, otherwise negative ROS  Physical Exam: BP (!) 133/51 (BP Location: Right Arm, Patient Position: Sitting, Cuff Size: Normal)   Pulse (!) 54   Temp 97.6 F (36.4 C) (Oral)   Ht 5' 7 (1.702 m)   Wt 144 lb 3.2 oz (65.4 kg)   SpO2 97%   BMI 22.58 kg/m  General:   Alert,  Well-developed, well-nourished,  pleasant and cooperative in NAD Neck:  Supple; no masses or thyromegaly. No significant cervical adenopathy. Lungs:  Clear throughout to auscultation.   No wheezes, crackles, or rhonchi. No acute distress. Heart:  Regular rate and rhythm; no murmurs, clicks, rubs,  or gallops. Abdomen: Non-distended, normal bowel sounds.  Soft and nontender without appreciable mass or hepatosplenomegaly.   Impression/Plan: 78 year old gentleman with probable SIBO responsive to antibiotic therapy.  Close interval recurrence of diarrhea after antibiotics.  Could be best served by cycling antibiotics monthly.  GERD well-controlled no reason to keep him on twice daily PPI therapy.  Colestid  seems to be firming  his stool nicely  Recommendations:  decrease pantoprazole or Protonix to 40 mg once daily 30 minutes before breakfast  Continue Colestid  twice daily  Take metronidazole  250 mg orally 3 times a day for 10 days at the start of each month (dispense 30 with 11 refills  Office visit with me in 6 months Notice: This dictation was prepared with Dragon dictation along with smaller phrase technology. Any transcriptional errors that result from this process are unintentional and may not be corrected upon review.

## 2023-09-14 NOTE — Patient Instructions (Signed)
 It was good to see you again today!  As discussed, decrease pantoprazole or Protonix to 40 mg once daily 30 minutes before breakfast  Continue Colestid  twice daily  Take metronidazole  250 mg orally 3 times a day for 10 days at the start of each month (dispense 30 with 11 refills  Office visit with me in 6 months

## 2023-09-28 ENCOUNTER — Other Ambulatory Visit: Payer: Self-pay

## 2023-09-28 DIAGNOSIS — R197 Diarrhea, unspecified: Secondary | ICD-10-CM

## 2023-09-28 DIAGNOSIS — K638219 Small intestinal bacterial overgrowth, unspecified: Secondary | ICD-10-CM

## 2023-09-28 MED ORDER — METRONIDAZOLE 250 MG PO TABS: 250.0000 mg | ORAL_TABLET | Freq: Three times a day (TID) | ORAL | 11 refills | Status: AC

## 2023-11-28 ENCOUNTER — Ambulatory Visit (HOSPITAL_COMMUNITY)
Admission: RE | Admit: 2023-11-28 | Discharge: 2023-11-28 | Disposition: A | Discharge status: Home / Self Care | Source: Ambulatory Visit | Attending: Internal Medicine | Admitting: Internal Medicine

## 2023-11-28 ENCOUNTER — Encounter (HOSPITAL_COMMUNITY): Payer: Self-pay | Admitting: Internal Medicine

## 2023-11-28 VITALS — BP 154/60 | HR 60 | Ht 67.0 in | Wt 142.6 lb

## 2023-11-28 DIAGNOSIS — I4819 Other persistent atrial fibrillation: Secondary | ICD-10-CM | POA: Insufficient documentation

## 2023-11-28 DIAGNOSIS — D6869 Other thrombophilia: Secondary | ICD-10-CM | POA: Diagnosis present

## 2023-11-28 NOTE — Progress Notes (Signed)
 Primary Care Physician: Jesusa Caroline, MD Primary Cardiologist: Dr Zacharey Electrophysiologist: [name], MD  Referring Physician: Dr Kelsie   Thomas Simon is a 78 y.o. male with a history of CAD s/p CABG, diabetes, HTN, hypothyroidism, atrial fibrillation who presents for follow up in the [facility].  The patient was initially diagnosed with atrial fibrillation 02/2020 after presenting with symptoms of fatigue and SOB. Patient is on Eliquis  for a CHADS2VASC score of 5. He underwent DCCV on 04/30/20. Unfortunately, he was back in afib before even leaving the hospital. He does note that he has more fatigue and SOB while in afib. Patient is s/p afib ablation 06/15/20. Unfortunately, he was back in afib about two days post ablation with symptoms of fatigue and lightheadedness. Patient is s/p DCCV on 06/23/20. He only stayed in SR for about 2 days. He was started on amiodarone  but this was later discontinued as he had no recurrence of afib.  On follow up 08/20/23, he is currently in NSR. He was recently seen by Dr. Furbish on 4/14 noting no recent episodes of Afib having discontinued amiodarone . He is most recently s/p successful DCCV in the ED on 08/13/23. He had recently been placed on metronidazole  for a bowel issue several days prior to ED visit. No missed doses of Eliquis .   On follow up 11/28/23, patient is currently in NSR. He reports low HR over the past month. He is taking Toprol  12.5 mg at bedtime. Overall low Afib burden. No bleeding issues on Eliquis .  Today, he denies symptoms of palpitations, chest pain, shortness of breath, orthopnea, PND, lower extremity edema, dizziness, presyncope, syncope, snoring, daytime somnolence, bleeding, or neurologic sequela. The patient is tolerating medications without difficulties and is otherwise without complaint today.    Atrial Fibrillation Risk Factors:  he does not have symptoms or diagnosis of sleep apnea. he  does not have a history of rheumatic fever. he does not have a history of alcohol use.   Atrial Fibrillation Management history:  Previous antiarrhythmic drugs: amiodarone  Previous cardioversions: 04/30/20, 06/23/20, 08/13/23 Previous ablations: 06/15/20 Anticoagulation history: Eliquis   ROS- All systems are reviewed and negative except as per the HPI above.   Physical Exam: There were no vitals taken for this visit.  GEN- The patient is well appearing, alert and oriented x 3 today.   Neck - no JVD or carotid bruit noted Lungs- Clear to ausculation bilaterally, normal work of breathing Heart- Regular rate and rhythm, no murmurs, rubs or gallops, PMI not laterally displaced Extremities- no clubbing, cyanosis, or edema Skin - no rash or ecchymosis noted   Wt Readings from Last 3 Encounters:  09/14/23 65.4 kg  08/20/23 66.4 kg  08/13/23 65.2 kg     EKG today demonstrates  Vent. rate 60 BPM PR interval 172 ms QRS duration 94 ms QT/QTcB 440/440 ms P-R-T axes 43 -10 50 Sinus rhythm with Premature supraventricular complexes Minimal voltage criteria for LVH, may be normal variant ( Sokolow-Lyon ) Borderline ECG When compared with ECG of 20-Aug-2023 09:56, Premature supraventricular complexes are now Present   CHA2DS2-VASc Score = 5  The patient's score is based upon: CHF History: 0 HTN History: 1 Diabetes History: 1 Stroke History: 0 Vascular Disease History: 1 Age Score: 2 Gender Score: 0       ASSESSMENT AND PLAN: Persistent Atrial Fibrillation/atrial flutter The patient's CHA2DS2-VASc score is 5, indicating a 7.2% annual risk of stroke.   S/p afib ablation 06/15/20. S/p DCCV in  the ED on 08/13/23.  He is currently in NSR. Low ongoing heart rate. Discontinue Toprol . Continue monitoring for Afib.   We discussed AAD therapy such as restarting amiodarone  versus repeat ablation in the future.   Secondary Hypercoagulable State (ICD10:  D68.69) The patient is at  significant risk for stroke/thromboembolism based upon his CHA2DS2-VASc Score of 5.  Continue Apixaban  (Eliquis ).  Continue OAC.   CAD S/p CABG Followed by Dr Eulogio  HTN Elevated today advised to monitor BP at home.     Follow up 6 months Afib clinic.    [name], PA-C Afib Clinic [facility] [address] [place], [place] [address] [phone]

## 2023-11-28 NOTE — Patient Instructions (Addendum)
 Stop metoprolol

## 2024-01-18 ENCOUNTER — Telehealth (HOSPITAL_COMMUNITY): Payer: Self-pay | Admitting: *Deleted

## 2024-01-18 NOTE — Telephone Encounter (Signed)
 Patient called in stating he went back into afib 4-5 days ago. HRs from 80-120 BP 130s/70s. Shortness of breath but denies swelling/wt gain. Pt is taking metoprolol  succinate 25mg  daily (pt restarted on his own after appt in August due to elevated BP when he stopped) per Thom Heinrich PA will add an extra 12.5mg  of metoprolol  and call with update on Monday. Appt made for 10/22. Pt in agreement.

## 2024-01-21 NOTE — Telephone Encounter (Signed)
 Pt continues in afib. HRs better controlled in the 80s with additional metoprolol . Appt scheduled for later this week. Pt in agreement.

## 2024-01-23 ENCOUNTER — Encounter (HOSPITAL_COMMUNITY): Payer: Self-pay | Admitting: Internal Medicine

## 2024-01-23 ENCOUNTER — Ambulatory Visit (HOSPITAL_COMMUNITY)
Admission: RE | Admit: 2024-01-23 | Discharge: 2024-01-23 | Disposition: A | Discharge status: Home / Self Care | Source: Ambulatory Visit | Attending: Internal Medicine | Admitting: Internal Medicine

## 2024-01-23 VITALS — BP 102/70 | HR 76 | Ht 67.0 in | Wt 147.2 lb

## 2024-01-23 DIAGNOSIS — D6869 Other thrombophilia: Secondary | ICD-10-CM | POA: Diagnosis present

## 2024-01-23 DIAGNOSIS — I4819 Other persistent atrial fibrillation: Secondary | ICD-10-CM | POA: Insufficient documentation

## 2024-01-23 MED ORDER — AMIODARONE HCL 200 MG PO TABS: ORAL_TABLET | ORAL | 3 refills | Status: DC

## 2024-01-23 MED ORDER — LOSARTAN POTASSIUM 100 MG PO TABS: 50.0000 mg | ORAL_TABLET | Freq: Every day | ORAL | 3 refills | Status: AC

## 2024-01-23 NOTE — Patient Instructions (Signed)
 Decrease losartan (COZAAR) to 50 mg daily  Start Amiodarone  200 mg twice a day for 30 days then decrease to 200 mg once a day  Do not miss any doses of blood thinner

## 2024-01-23 NOTE — Progress Notes (Signed)
 Primary Care Physician: Thomas Caroline, MD Primary Cardiologist: Dr Thomas Electrophysiologist: Thomas FORBES Furbish, MD  Referring Physician: Dr Thomas   Taevin Simon is a 78 y.o. male with a history of CAD s/p CABG, diabetes, HTN, hypothyroidism, atrial fibrillation who presents for follow up in the [facility].  The patient was initially diagnosed with atrial fibrillation 02/2020 after presenting with symptoms of fatigue and SOB. Patient is on Eliquis  for a CHADS2VASC score of 5. He underwent DCCV on 04/30/20. Unfortunately, he was back in afib before even leaving the hospital. He does note that he has more fatigue and SOB while in afib. Patient is s/p afib ablation 06/15/20. Unfortunately, he was back in afib about two days post ablation with symptoms of fatigue and lightheadedness. Patient is s/p DCCV on 06/23/20. He only stayed in SR for about 2 days. He was started on amiodarone  but this was later discontinued as he had no recurrence of afib.  On follow up 08/20/23, he is currently in NSR. He was recently seen by Dr. Furbish on 4/14 noting no recent episodes of Afib having discontinued amiodarone . He is most recently s/p successful DCCV in the ED on 08/13/23. He had recently been placed on metronidazole  for a bowel issue several days prior to ED visit. No missed doses of Eliquis .   On follow up 11/28/23, patient is currently in NSR. He reports low HR over the past month. He is taking Toprol  12.5 mg at bedtime. Overall low Afib burden. No bleeding issues on Eliquis .  On follow up 01/23/24, patient is currently in Afib. He feels unwell when out of rhythm. No missed doses of Eliquis .   Today, he denies symptoms of palpitations, chest pain, shortness of breath, orthopnea, PND, lower extremity edema, dizziness, presyncope, syncope, snoring, daytime somnolence, bleeding, or neurologic sequela. The patient is tolerating medications without difficulties and is otherwise  without complaint today.    Atrial Fibrillation Risk Factors:  he does not have symptoms or diagnosis of sleep apnea. he does not have a history of rheumatic fever. he does not have a history of alcohol use.   Atrial Fibrillation Management history:  Previous antiarrhythmic drugs: amiodarone  Previous cardioversions: 04/30/20, 06/23/20, 08/13/23 Previous ablations: 06/15/20 Anticoagulation history: Eliquis   ROS- All systems are reviewed and negative except as per the HPI above.   Physical Exam: BP 102/70   Pulse 76   Ht 5' 7 (1.702 m)   Wt 66.8 kg   SpO2 95%   BMI 23.05 kg/m   GEN- The patient is well appearing, alert and oriented x 3 today.   Neck - no JVD or carotid bruit noted Lungs- Clear to ausculation bilaterally, normal work of breathing Heart- Irregular rate and rhythm, no murmurs, rubs or gallops, PMI not laterally displaced Extremities- no clubbing, cyanosis, or edema Skin - no rash or ecchymosis noted    Wt Readings from Last 3 Encounters:  01/23/24 66.8 kg  11/28/23 64.7 kg  09/14/23 65.4 kg     EKG today demonstrates  Vent. rate 76 BPM PR interval * ms QRS duration 94 ms QT/QTcB 394/443 ms P-R-T axes * -8 28 Atrial fibrillation Left ventricular hypertrophy ( R in aVL , Sokolow-Lyon , Cornell product ) Abnormal ECG When compared with ECG of 28-Nov-2023 09:10, Atrial fibrillation has replaced Sinus rhythm   CHA2DS2-VASc Score = 5  The patient's score is based upon: CHF History: 0 HTN History: 1 Diabetes History: 1 Stroke History: 0 Vascular Disease History: 1  Age Score: 2 Gender Score: 0       ASSESSMENT AND PLAN: Persistent Atrial Fibrillation/atrial flutter The patient's CHA2DS2-VASc score is 5, indicating a 7.2% annual risk of stroke.   S/p afib ablation 06/15/20. S/p DCCV in the ED on 08/13/23.  Patient is currently in Afib. We had a discussion about medication treatments and ablation in detail. We also discussed pulse field ablation  as an option in the form of intervention. After discussion, patient is interested in speaking with EP regarding potential for Afib ablation. He also wishes to restart amiodarone  to try to restore normal rhythm. Begin amiodarone  200 mg BID x 1 month then transition to 200 mg once daily. Repeat ECG in 3 weeks to determine if needs cardioversion. Reduce losartan to 50 mg daily. Refer to EP to discuss ablation.   Secondary Hypercoagulable State (ICD10:  D68.69) The patient is at significant risk for stroke/thromboembolism based upon his CHA2DS2-VASc Score of 5.  Continue Apixaban  (Eliquis ).  No missed doses.   CAD S/p CABG Followed by Dr Thomas Simon  HTN Reducing losartan due to starting amiodarone  load.    Repeat ECG 3 weeks. Refer to EP.   Thomas Heinrich, PA-C Afib Clinic [facility] [address] [place], [place] [address] [phone]

## 2024-01-23 NOTE — H&P (View-Only) (Signed)
 Primary Care Physician: Thomas Caroline, MD Primary Cardiologist: Dr Thomas Simon Electrophysiologist: Thomas FORBES Furbish, MD  Referring Physician: Dr Thomas Simon is a 78 y.o. male with a history of CAD s/p CABG, diabetes, HTN, hypothyroidism, atrial fibrillation who presents for follow up in the [facility].  The patient was initially diagnosed with atrial fibrillation 02/2020 after presenting with symptoms of fatigue and SOB. Patient is on Eliquis  for a CHADS2VASC score of 5. He underwent DCCV on 04/30/20. Unfortunately, he was back in afib before even leaving the hospital. He does note that he has more fatigue and SOB while in afib. Patient is s/p afib ablation 06/15/20. Unfortunately, he was back in afib about two days post ablation with symptoms of fatigue and lightheadedness. Patient is s/p DCCV on 06/23/20. He only stayed in SR for about 2 days. He was started on amiodarone  but this was later discontinued as he had no recurrence of afib.  On follow up 08/20/23, he is currently in NSR. He was recently seen by Dr. Furbish on 4/14 noting no recent episodes of Afib having discontinued amiodarone . He is most recently s/p successful DCCV in the ED on 08/13/23. He had recently been placed on metronidazole  for a bowel issue several days prior to ED visit. No missed doses of Eliquis .   On follow up 11/28/23, patient is currently in NSR. He reports low HR over the past month. He is taking Toprol  12.5 mg at bedtime. Overall low Afib burden. No bleeding issues on Eliquis .  On follow up 01/23/24, patient is currently in Afib. He feels unwell when out of rhythm. No missed doses of Eliquis .   Today, he denies symptoms of palpitations, chest pain, shortness of breath, orthopnea, PND, lower extremity edema, dizziness, presyncope, syncope, snoring, daytime somnolence, bleeding, or neurologic sequela. The patient is tolerating medications without difficulties and is otherwise  without complaint today.    Atrial Fibrillation Risk Factors:  he does not have symptoms or diagnosis of sleep apnea. he does not have a history of rheumatic fever. he does not have a history of alcohol use.   Atrial Fibrillation Management history:  Previous antiarrhythmic drugs: amiodarone  Previous cardioversions: 04/30/20, 06/23/20, 08/13/23 Previous ablations: 06/15/20 Anticoagulation history: Eliquis   ROS- All systems are reviewed and negative except as per the HPI above.   Physical Exam: BP 102/70   Pulse 76   Ht 5' 7 (1.702 m)   Wt 66.8 kg   SpO2 95%   BMI 23.05 kg/m   GEN- The patient is well appearing, alert and oriented x 3 today.   Neck - no JVD or carotid bruit noted Lungs- Clear to ausculation bilaterally, normal work of breathing Heart- Irregular rate and rhythm, no murmurs, rubs or gallops, PMI not laterally displaced Extremities- no clubbing, cyanosis, or edema Skin - no rash or ecchymosis noted    Wt Readings from Last 3 Encounters:  01/23/24 66.8 kg  11/28/23 64.7 kg  09/14/23 65.4 kg     EKG today demonstrates  Vent. rate 76 BPM PR interval * ms QRS duration 94 ms QT/QTcB 394/443 ms P-R-T axes * -8 28 Atrial fibrillation Left ventricular hypertrophy ( R in aVL , Sokolow-Lyon , Cornell product ) Abnormal ECG When compared with ECG of 28-Nov-2023 09:10, Atrial fibrillation has replaced Sinus rhythm   CHA2DS2-VASc Score = 5  The patient's score is based upon: CHF History: 0 HTN History: 1 Diabetes History: 1 Stroke History: 0 Vascular Disease History: 1  Age Score: 2 Gender Score: 0       ASSESSMENT AND PLAN: Persistent Atrial Fibrillation/atrial flutter The patient's CHA2DS2-VASc score is 5, indicating a 7.2% annual risk of stroke.   S/p afib ablation 06/15/20. S/p DCCV in the ED on 08/13/23.  Patient is currently in Afib. We had a discussion about medication treatments and ablation in detail. We also discussed pulse field ablation  as an option in the form of intervention. After discussion, patient is interested in speaking with EP regarding potential for Afib ablation. He also wishes to restart amiodarone  to try to restore normal rhythm. Begin amiodarone  200 mg BID x 1 month then transition to 200 mg once daily. Repeat ECG in 3 weeks to determine if needs cardioversion. Reduce losartan to 50 mg daily. Refer to EP to discuss ablation.   Secondary Hypercoagulable State (ICD10:  D68.69) The patient is at significant risk for stroke/thromboembolism based upon his CHA2DS2-VASc Score of 5.  Continue Apixaban  (Eliquis ).  No missed doses.   CAD S/p CABG Followed by Dr Thomas Simon  HTN Reducing losartan due to starting amiodarone  load.    Repeat ECG 3 weeks. Refer to EP.   Thomas Heinrich, PA-C Afib Clinic [facility] [address] [place], [place] [address] [phone]

## 2024-02-13 ENCOUNTER — Ambulatory Visit (HOSPITAL_COMMUNITY)
Admission: RE | Admit: 2024-02-13 | Discharge: 2024-02-13 | Disposition: A | Discharge status: Home / Self Care | Source: Ambulatory Visit | Attending: Internal Medicine | Admitting: Internal Medicine

## 2024-02-13 ENCOUNTER — Ambulatory Visit (HOSPITAL_COMMUNITY): Payer: Self-pay | Admitting: Internal Medicine

## 2024-02-13 DIAGNOSIS — D6869 Other thrombophilia: Secondary | ICD-10-CM | POA: Insufficient documentation

## 2024-02-13 DIAGNOSIS — I4819 Other persistent atrial fibrillation: Secondary | ICD-10-CM | POA: Diagnosis present

## 2024-02-13 DIAGNOSIS — Z79899 Other long term (current) drug therapy: Secondary | ICD-10-CM | POA: Insufficient documentation

## 2024-02-13 DIAGNOSIS — Z5181 Encounter for therapeutic drug level monitoring: Secondary | ICD-10-CM | POA: Insufficient documentation

## 2024-02-13 LAB — CBC
Hematocrit: 38.7 % (ref 37.5–51.0)
Hemoglobin: 12.5 g/dL — ABNORMAL LOW (ref 13.0–17.7)
MCH: 32.5 pg (ref 26.6–33.0)
MCHC: 32.3 g/dL (ref 31.5–35.7)
MCV: 101 fL — ABNORMAL HIGH (ref 79–97)
Platelets: 118 x10E3/uL — ABNORMAL LOW (ref 150–450)
RBC: 3.85 x10E6/uL — ABNORMAL LOW (ref 4.14–5.80)
RDW: 13.1 % (ref 11.6–15.4)
WBC: 6.4 x10E3/uL (ref 3.4–10.8)

## 2024-02-13 LAB — BASIC METABOLIC PANEL WITH GFR
BUN/Creatinine Ratio: 23 (ref 10–24)
BUN: 19 mg/dL (ref 8–27)
CO2: 28 mmol/L (ref 20–29)
Calcium: 8.4 mg/dL — ABNORMAL LOW (ref 8.6–10.2)
Chloride: 102 mmol/L (ref 96–106)
Creatinine, Ser: 0.84 mg/dL (ref 0.76–1.27)
Glucose: 165 mg/dL — ABNORMAL HIGH (ref 70–99)
Potassium: 4.1 mmol/L (ref 3.5–5.2)
Sodium: 137 mmol/L (ref 134–144)
eGFR: 89 mL/min/1.73 (ref >59)

## 2024-02-13 NOTE — Progress Notes (Signed)
 Patient reloaded amiodarone  on 10/22 due to being back in Afib.   ECG today shows  Vent. rate 69 BPM PR interval * ms QRS duration 98 ms QT/QTcB 430/460 ms P-R-T axes * -6 13 Atrial fibrillation Left ventricular hypertrophy ( R in aVL , Sokolow-Lyon , Cornell product ) Abnormal ECG When compared with ECG of 23-Jan-2024 08:22, No significant change was found   We discussed the procedure cardioversion to try to convert to NSR. We discussed the risks vs benefits of this procedure and how ultimately we cannot predict whether a patient will have early return of arrhythmia post procedure. After discussion, the patient wishes to proceed with cardioversion. Labs drawn today.   Informed Consent   Shared Decision Making/Informed Consent The risks (stroke, cardiac arrhythmias rarely resulting in the need for a temporary or permanent pacemaker, skin irritation or burns and complications associated with conscious sedation including aspiration, arrhythmia, respiratory failure and death), benefits (restoration of normal sinus rhythm) and alternatives of a direct current cardioversion were explained in detail to Mr. Nazaryan and he agrees to proceed.       Follow up after DCCV as scheduled with Dr. Inocencio.

## 2024-02-13 NOTE — Addendum Note (Signed)
 Encounter addended by: Janel Nancy SAUNDERS, RN on: 02/13/2024 9:49 AM  Actions taken: Order list changed, Diagnosis association updated

## 2024-02-13 NOTE — Patient Instructions (Signed)
 Cardioversion scheduled for: 02/14/24 Wednesday at 9:30   - Arrive at the Main Entrance A of [facility] ([address])  and check in with ADMITTING at 9;30 AM    - Do not eat or drink anything after midnight the night prior to your procedure.   - Take all your morning medication (except diabetic medications) with a sip of water  prior to arrival.  - Do NOT miss any doses of your blood thinner - if you should miss a dose or take a dose more than 4 hours late -- please notify our office immediately.  - You will not be able to drive home after your procedure. Please ensure you have a responsible adult to drive you home. You will need someone with you for 24 hours post procedure.     - Expect to be in the procedural area approximately 2 hours.   - If you feel as if you go back into normal rhythm prior to scheduled cardioversion, please notify our office immediately.   If your procedure is canceled in the cardioversion suite you will be charged a cancellation fee.

## 2024-02-14 ENCOUNTER — Ambulatory Visit (HOSPITAL_COMMUNITY)

## 2024-02-14 ENCOUNTER — Other Ambulatory Visit: Payer: Self-pay

## 2024-02-14 ENCOUNTER — Encounter (HOSPITAL_COMMUNITY)
Admission: RE | Disposition: A | Discharge status: Home / Self Care | Payer: Self-pay | Source: Home / Self Care | Attending: Cardiology

## 2024-02-14 ENCOUNTER — Encounter (HOSPITAL_COMMUNITY): Payer: Self-pay | Admitting: Cardiology

## 2024-02-14 ENCOUNTER — Ambulatory Visit (HOSPITAL_COMMUNITY)
Admission: RE | Admit: 2024-02-14 | Discharge: 2024-02-14 | Disposition: A | Discharge status: Home / Self Care | Attending: Cardiology | Admitting: Cardiology

## 2024-02-14 DIAGNOSIS — D6869 Other thrombophilia: Secondary | ICD-10-CM | POA: Insufficient documentation

## 2024-02-14 DIAGNOSIS — Z87891 Personal history of nicotine dependence: Secondary | ICD-10-CM | POA: Insufficient documentation

## 2024-02-14 DIAGNOSIS — I10 Essential (primary) hypertension: Secondary | ICD-10-CM | POA: Diagnosis not present

## 2024-02-14 DIAGNOSIS — Z79899 Other long term (current) drug therapy: Secondary | ICD-10-CM | POA: Insufficient documentation

## 2024-02-14 DIAGNOSIS — Z7901 Long term (current) use of anticoagulants: Secondary | ICD-10-CM | POA: Insufficient documentation

## 2024-02-14 DIAGNOSIS — K219 Gastro-esophageal reflux disease without esophagitis: Secondary | ICD-10-CM | POA: Insufficient documentation

## 2024-02-14 DIAGNOSIS — I2510 Atherosclerotic heart disease of native coronary artery without angina pectoris: Secondary | ICD-10-CM | POA: Diagnosis not present

## 2024-02-14 DIAGNOSIS — I4819 Other persistent atrial fibrillation: Secondary | ICD-10-CM

## 2024-02-14 DIAGNOSIS — Z951 Presence of aortocoronary bypass graft: Secondary | ICD-10-CM | POA: Diagnosis not present

## 2024-02-14 DIAGNOSIS — Z006 Encounter for examination for normal comparison and control in clinical research program: Secondary | ICD-10-CM

## 2024-02-14 HISTORY — PX: CARDIOVERSION: EP1203

## 2024-02-14 SURGERY — CARDIOVERSION (CATH LAB)
Anesthesia: General

## 2024-02-14 MED ORDER — METOPROLOL SUCCINATE ER 25 MG PO TB24: 25.0000 mg | ORAL_TABLET | Freq: Every day | ORAL | 3 refills | Status: AC

## 2024-02-14 MED ORDER — PROPOFOL 10 MG/ML IV BOLUS
INTRAVENOUS | Status: DC | PRN
  Administered 2024-02-14: 50 mg via INTRAVENOUS

## 2024-02-14 MED ORDER — LIDOCAINE 2% (20 MG/ML) 5 ML SYRINGE
INTRAMUSCULAR | Status: DC | PRN
  Administered 2024-02-14: 60 mg via INTRAVENOUS

## 2024-02-14 MED ORDER — SODIUM CHLORIDE 0.9 % IV SOLN: INTRAVENOUS | Status: DC

## 2024-02-14 SURGICAL SUPPLY — 1 items: PAD DEFIB RADIO PHYSIO CONN (PAD) ×1 IMPLANT

## 2024-02-14 NOTE — Anesthesia Postprocedure Evaluation (Signed)
 Anesthesia Post Note  Patient: Linford Quintela  Procedure(s) Performed: CARDIOVERSION     Patient location during evaluation: PACU Anesthesia Type: General Level of consciousness: awake and alert Pain management: pain level controlled Vital Signs Assessment: post-procedure vital signs reviewed and stable Respiratory status: spontaneous breathing, nonlabored ventilation, respiratory function stable and patient connected to nasal cannula oxygen Cardiovascular status: blood pressure returned to baseline and stable Postop Assessment: no apparent nausea or vomiting Anesthetic complications: no   There were no known notable events for this encounter.  Last Vitals:  Vitals:   02/14/24 1102 02/14/24 1112  BP: (!) 133/58 (!) 113/53  Pulse: (!) 48 (!) 48  Resp: 12 19  Temp:    SpO2: 97% 97%    Last Pain:  Vitals:   02/14/24 1112  TempSrc:   PainSc: 0-No pain                 Thom JONELLE Peoples

## 2024-02-14 NOTE — CV Procedure (Signed)
 Procedure:   DCCV  Indication:  Symptomatic atrial fibrillation  Procedure Note:  The patient signed informed consent.  They have had had therapeutic anticoagulation with Eliquis  greater than 3 weeks.  Anesthesia was administered by Dr. Treen and Violeta Saenz, CRNA.  Adequate airway was maintained throughout and vital followed per protocol.  They were cardioverted x 1 with 200J of biphasic synchronized energy.  They converted to NSR.  HR in 40s after DCCV, decreased toprol  XL dose to 25 mg daily.    There were no apparent complications.  The patient had normal neuro status and respiratory status post procedure with vitals stable as recorded elsewhere.    Follow up:  They will continue on current medical therapy and follow up with cardiology as scheduled.  Lonni Nanas, MD 02/14/2024 10:53 AM

## 2024-02-14 NOTE — Interval H&P Note (Signed)
 History and Physical Interval Note:  02/14/2024 10:20 AM  Thomas Simon  has presented today for surgery, with the diagnosis of afib.  The various methods of treatment have been discussed with the patient and family. After consideration of risks, benefits and other options for treatment, the patient has consented to  Procedure(s): CARDIOVERSION (N/A) as a surgical intervention.  The patient's history has been reviewed, patient examined, no change in status, stable for surgery.  I have reviewed the patient's chart and labs.  Questions were answered to the patient's satisfaction.     [name]

## 2024-02-14 NOTE — Anesthesia Preprocedure Evaluation (Signed)
 Anesthesia Evaluation  Patient identified by MRN, date of birth, ID band Patient awake    Reviewed: Allergy & Precautions, H&P , NPO status , Patient's Chart, lab work & pertinent test results, reviewed documented beta blocker date and time   History of Anesthesia Complications Negative for: history of anesthetic complications  Airway Mallampati: II  TM Distance: >3 FB Neck ROM: full    Dental  (+) Upper Dentures, Lower Dentures   Pulmonary neg pulmonary ROS, former smoker   Pulmonary exam normal breath sounds clear to auscultation       Cardiovascular Exercise Tolerance: Good hypertension, (-) angina + CAD, + CABG and + Peripheral Vascular Disease  (-) Past MI + dysrhythmias Atrial Fibrillation  Rhythm:Irregular Rate:Normal     Neuro/Psych neg Seizures negative neurological ROS  negative psych ROS   GI/Hepatic Neg liver ROS,GERD  Medicated,,  Endo/Other  negative endocrine ROS    Renal/GU negative Renal ROS  negative genitourinary   Musculoskeletal   Abdominal   Peds  Hematology  (+) Blood dyscrasia, anemia   Anesthesia Other Findings   Reproductive/Obstetrics negative OB ROS                              Anesthesia Physical Anesthesia Plan  ASA: 3  Anesthesia Plan: General   Post-op Pain Management:    Induction:   PONV Risk Score and Plan: Propofol  infusion  Airway Management Planned:   Additional Equipment:   Intra-op Plan:   Post-operative Plan:   Informed Consent: I have reviewed the patients History and Physical, chart, labs and discussed the procedure including the risks, benefits and alternatives for the proposed anesthesia with the patient or authorized representative who has indicated his/her understanding and acceptance.     Dental Advisory Given  Plan Discussed with: CRNA  Anesthesia Plan Comments:         Anesthesia Quick Evaluation

## 2024-02-14 NOTE — Transfer of Care (Signed)
 Immediate Anesthesia Transfer of Care Note  Patient: Thomas Simon  Procedure(s) Performed: CARDIOVERSION  Patient Location: Cath Lab  Anesthesia Type:General  Level of Consciousness: awake, alert , and oriented  Airway & Oxygen Therapy: Patient Spontanous Breathing and Patient connected to nasal cannula oxygen  Post-op Assessment: Report given to RN and Post -op Vital signs reviewed and stable  Post vital signs: Reviewed and stable  Last Vitals:  Vitals Value Taken Time  BP 136/64 1041  Temp 36 1041  Pulse 48 1041  Resp 16 1041  SpO2 99 1041    Last Pain:  Vitals:   02/14/24 1035  TempSrc:   PainSc: 0-No pain         Complications: No notable events documented.

## 2024-02-14 NOTE — Discharge Instructions (Signed)

## 2024-02-14 NOTE — Research (Signed)
 [facility] Cardioversion Informed Consent   Subject Name: Thomas Simon  Subject met inclusion and exclusion criteria.  The informed consent form, study requirements and expectations were reviewed with the subject and questions and concerns were addressed prior to the signing of the consent form.  The subject verbalized understanding of the trial requirements.  The subject agreed to participate in the [facility] Cardioversion trial and signed the informed consent at 0832 on 13/Nov/2025.  The informed consent was obtained prior to performance of any protocol-specific procedures for the subject.  A copy of the signed informed consent was given to the subject and a copy was placed in the subject's medical record.   Thomas Simon Thomas Simon

## 2024-02-18 ENCOUNTER — Ambulatory Visit (HOSPITAL_COMMUNITY): Admitting: Internal Medicine

## 2024-02-20 ENCOUNTER — Encounter: Payer: Self-pay | Admitting: Cardiovascular Disease

## 2024-02-20 ENCOUNTER — Ambulatory Visit: Attending: Cardiovascular Disease | Admitting: Cardiovascular Disease

## 2024-02-20 VITALS — BP 149/62 | HR 77 | Ht 67.0 in | Wt 157.0 lb

## 2024-02-20 DIAGNOSIS — I4819 Other persistent atrial fibrillation: Secondary | ICD-10-CM | POA: Insufficient documentation

## 2024-02-20 DIAGNOSIS — Z01812 Encounter for preprocedural laboratory examination: Secondary | ICD-10-CM | POA: Insufficient documentation

## 2024-02-20 NOTE — Patient Instructions (Signed)
 Medication Instructions:  Your physician recommends that you continue on your current medications as directed. Please refer to the Current Medication list given to you today.  *If you need a refill on your cardiac medications before your next appointment, please call your pharmacy*  Testing/Procedures: Ablation Your physician has recommended that you have an ablation. Catheter ablation is a medical procedure used to treat some cardiac arrhythmias (irregular heartbeats). During catheter ablation, a long, thin, flexible tube is put into a blood vessel in your groin (upper thigh), or neck. This tube is called an ablation catheter. It is then guided to your heart through the blood vessel. Radio frequency waves destroy small areas of heart tissue where abnormal heartbeats may cause an arrhythmia to start.   You are scheduled for Atrial Fibrillation Ablation on Friday, January 16 with Dr. Dr. Nancey. Please arrive at the Main Entrance A at [facility]: [address] [place], [place] [address] at 5:30 AM   What To Expect:  Labs: you will need to have lab work drawn within 30 days of your procedure. Please go to any [facility] location to have these drawn - no appointment is needed. You will receive procedure instructions either through MyChart or in the mail 4-6 week prior to your procedure.  After your procedure we recommend no driving for 4 days, no lifting over 5 lbs for 7 days, and no work or strenuous activity for 7 days.  Please contact our office at [phone] if you have any questions.    Follow-Up: We will contact you to schedule your post-procedure appointments.

## 2024-02-20 NOTE — Progress Notes (Signed)
   PCP: Thomas Caroline, MD Primary Cardiologist: Dr Arthea Primary EP: Dr Kelsie  Thomas Simon is a 78 y.o. male who presents today for routine electrophysiology followup.  Since last being seen in our clinic, the patient reports doing very well.  Today,   He was diagnosed with atrial fibrillation in November 2021, presenting with symptoms of fatigue and shortness of breath.  DC cardioversion was performed in January 2022 but he had recurrence of atrial fibrillation prior to leaving the hospital.  Ablation was performed in March 2022.  He had recurrence of atrial fibrillation 2 days after his ablation.  Amiodarone  was restarted, and he maintained sinus rhythm for quite some time.  Amiodarone  was discontinued in April 2025, and he had recurrence of atrial fibrillation within a month afterwards.  DC cardioversion was performed in May, and he maintained sinus rhythm until October.  At that time amiodarone  was started and he underwent another DC cardioversion in November 2025.      Physical Exam: Vitals:   02/20/24 1146  BP: (!) 149/62  Pulse: 77  SpO2: 95%  Weight: 157 lb (71.2 kg)  Height: 5' 7 (1.702 m)    GEN- The patient is well appearing, alert and oriented x 3 today.   Head- normocephalic, atraumatic Eyes-  Sclera clear, conjunctiva pink Ears- hearing intact Oropharynx- clear Lungs- Clear to ausculation bilaterally, normal work of breathing Heart- Regular rate and rhythm, no murmurs, rubs or gallops, PMI not laterally displaced GI- soft, NT, ND, + BS Extremities- no clubbing, cyanosis, or edema  Wt Readings from Last 3 Encounters:  02/20/24 157 lb (71.2 kg)  02/14/24 145 lb (65.8 kg)  01/23/24 147 lb 3.2 oz (66.8 kg)    EKG tracing ordered today is personally reviewed and shows sinus  Assessment and Plan:  Persistent afib Status post ablation with radiofrequency in March 2022. Now with recurrence of atrial fibrillation and quite symptomatic. We discussed  management options.  Because he is symptomatic, I think it reasonable to repeat atrial mapping and possible ablation with pulsed field energy since the procedure is significantly faster and safer than prior modalities.  I explained the logistics and risks of the procedure which include but are not limited to bleeding at the access site, need for prolonged hospital stay or cardiac surgery, and a low but nonzero risk of stroke heart attack or death.  He would like to proceed. Chads2vasc score is 5.  He is on eliquis  5 BID. Will plan for cardiac CT.  Will plan to use the Carto system.   2. CAD s/p CABG Stable No changes today  3. HTN Stable No change required today Bmet today  Risks, benefits and potential toxicities for medications prescribed and/or refilled reviewed with patient today.   Return to AF clinic in 6 months I will see in a year  Thomas Simon  02/20/2024 12:32 PM

## 2024-03-12 ENCOUNTER — Telehealth: Payer: Self-pay

## 2024-03-12 NOTE — Telephone Encounter (Signed)
-----   Message from Nurse Aldona R sent at 02/20/2024  6:06 PM EST ----- Regarding: 04/18/24 730am- repeat afib- Mealor Important: list procedure date as first item in subject line, followed by procedure type (e.g., 12/14/23 AFib ablation)  Precert:  MD: Mealor Type of ablation: A-fib - repeat Diagnosis: afib CPT code: A-fib (06343) Ablation scheduled (date/time): 01/16 730am  Procedure:  Added to calendar? Yes Orders entered? No, >30 days before procedure Letter complete? No, >30 days before procedure Scheduled with cath lab? Yes Any medications to hold? Routine Labs ordered (CBC, BMET, PT/INR if on warfarin): Yes Mapping system: Doesn't matter CARTO/OPAL rep notified? No Cardiac CT needed? Yes, ordered - Pt does not have letter.  This was an after thought of Dr Mealor Dye allergy? No Pre-meds ordered and instructions given? N/A Letter method: mail H&P: 11/19 Device: No  Follow-up:  Cassie/Angel, please schedule Routine.  Covering RN - please send this message to [employer], EP scheduler, EP Scheduling pool, EP [employer], and CT scheduler (Brittany Lynch/Stephanie Mogg), if indicated.

## 2024-03-24 ENCOUNTER — Ambulatory Visit (HOSPITAL_COMMUNITY)
Admission: RE | Admit: 2024-03-24 | Discharge: 2024-03-24 | Disposition: A | Discharge status: Home / Self Care | Source: Ambulatory Visit | Attending: Cardiovascular Disease | Admitting: Cardiovascular Disease

## 2024-03-24 DIAGNOSIS — I4819 Other persistent atrial fibrillation: Secondary | ICD-10-CM | POA: Diagnosis not present

## 2024-03-24 MED ORDER — IOHEXOL 350 MG/ML SOLN
100.0000 mL | Freq: Once | INTRAVENOUS | Status: AC | PRN
  Administered 2024-03-24: 100 mL via INTRAVENOUS

## 2024-03-25 LAB — BASIC METABOLIC PANEL WITH GFR
BUN/Creatinine Ratio: 19 (ref 10–24)
BUN: 16 mg/dL (ref 8–27)
CO2: 25 mmol/L (ref 20–29)
Calcium: 8.2 mg/dL — ABNORMAL LOW (ref 8.6–10.2)
Chloride: 100 mmol/L (ref 96–106)
Creatinine, Ser: 0.86 mg/dL (ref 0.76–1.27)
Glucose: 253 mg/dL — ABNORMAL HIGH (ref 70–99)
Potassium: 4.8 mmol/L (ref 3.5–5.2)
Sodium: 139 mmol/L (ref 134–144)
eGFR: 88 mL/min/1.73

## 2024-03-25 LAB — CBC
Hematocrit: 35.6 % — ABNORMAL LOW (ref 37.5–51.0)
Hemoglobin: 11.8 g/dL — ABNORMAL LOW (ref 13.0–17.7)
MCH: 33.6 pg — ABNORMAL HIGH (ref 26.6–33.0)
MCHC: 33.1 g/dL (ref 31.5–35.7)
MCV: 101 fL — ABNORMAL HIGH (ref 79–97)
Platelets: 123 x10E3/uL — ABNORMAL LOW (ref 150–450)
RBC: 3.51 x10E6/uL — ABNORMAL LOW (ref 4.14–5.80)
RDW: 12.1 % (ref 11.6–15.4)
WBC: 4.8 x10E3/uL (ref 3.4–10.8)

## 2024-03-31 ENCOUNTER — Ambulatory Visit: Payer: Self-pay | Admitting: Cardiovascular Disease

## 2024-04-02 ENCOUNTER — Telehealth (HOSPITAL_COMMUNITY): Payer: Self-pay

## 2024-04-02 ENCOUNTER — Encounter (HOSPITAL_COMMUNITY): Payer: Self-pay

## 2024-04-02 NOTE — Telephone Encounter (Signed)
 Spoke with patient to discuss upcoming procedure.   CT: completed.  Labs: completed.   Any recent signs of acute illness or been started on antibiotics? No Any new medications started?No Any medications to hold?  No Any missed doses of blood thinner?  No Advised patient to continue taking Eliquis  (Apixaban ) twice daily without missing any doses.  Medication instructions:  On the morning of your procedure DO NOT take any medication., including Eliquis  (Apixaban ) or the procedure may be rescheduled. Nothing to eat or drink after midnight prior to your procedure.  Confirmed patient is scheduled for Atrial Fibrillation Ablation on Friday, January 16 with Dr. Nancey. Instructed patient to arrive at the Main Entrance A at [facility]: [address] [place], [place] [address] and check in at Admitting at 5:30 AM.   Plan to go home the same day, you will only stay overnight if medically necessary. You MUST have a responsible adult to drive you home and MUST be with you the first 24 hours after you arrive home or your procedure could be cancelled.  Informed patient a nurse will call a day before the procedure to confirm arrival time and ensure instructions are followed.  Patient verbalized understanding to all instructions provided and agreed to proceed with procedure.   Advised patient to contact RN Navigator at [phone], to inform of any new medications started after call or concerns prior to procedure.

## 2024-04-17 NOTE — Pre-Procedure Instructions (Signed)
 Instructed patient on the following items: Arrival time 0515 Nothing to eat or drink after midnight No meds AM of procedure Responsible person to drive you home and stay with you for 24 hrs  Have you missed any doses of anti-coagulant Eliquis- takes twice a day, hasn't missed any doses in last 4 weeks.  Don't take dose morning of procedure.

## 2024-04-18 ENCOUNTER — Ambulatory Visit (HOSPITAL_COMMUNITY)
Admission: RE | Admit: 2024-04-18 | Discharge: 2024-04-18 | Disposition: A | Discharge status: Home / Self Care | Attending: Cardiovascular Disease | Admitting: Cardiovascular Disease

## 2024-04-18 ENCOUNTER — Other Ambulatory Visit: Payer: Self-pay

## 2024-04-18 ENCOUNTER — Encounter (HOSPITAL_COMMUNITY): Admission: RE | Disposition: A | Payer: Self-pay | Source: Home / Self Care | Attending: Cardiovascular Disease

## 2024-04-18 ENCOUNTER — Ambulatory Visit (HOSPITAL_COMMUNITY): Admitting: Anesthesiology

## 2024-04-18 DIAGNOSIS — Z7901 Long term (current) use of anticoagulants: Secondary | ICD-10-CM | POA: Insufficient documentation

## 2024-04-18 DIAGNOSIS — E1151 Type 2 diabetes mellitus with diabetic peripheral angiopathy without gangrene: Secondary | ICD-10-CM | POA: Insufficient documentation

## 2024-04-18 DIAGNOSIS — I2510 Atherosclerotic heart disease of native coronary artery without angina pectoris: Secondary | ICD-10-CM | POA: Insufficient documentation

## 2024-04-18 DIAGNOSIS — I4819 Other persistent atrial fibrillation: Secondary | ICD-10-CM

## 2024-04-18 DIAGNOSIS — I10 Essential (primary) hypertension: Secondary | ICD-10-CM | POA: Diagnosis not present

## 2024-04-18 DIAGNOSIS — Z87891 Personal history of nicotine dependence: Secondary | ICD-10-CM | POA: Diagnosis not present

## 2024-04-18 DIAGNOSIS — Z79899 Other long term (current) drug therapy: Secondary | ICD-10-CM | POA: Diagnosis not present

## 2024-04-18 DIAGNOSIS — Z951 Presence of aortocoronary bypass graft: Secondary | ICD-10-CM | POA: Diagnosis not present

## 2024-04-18 HISTORY — PX: ATRIAL FIBRILLATION ABLATION: EP1191

## 2024-04-18 LAB — GLUCOSE, CAPILLARY: Glucose-Capillary: 155 mg/dL — ABNORMAL HIGH (ref 70–99)

## 2024-04-18 LAB — POCT ACTIVATED CLOTTING TIME: Activated Clotting Time: 214 s

## 2024-04-18 MED ORDER — HEPARIN SODIUM (PORCINE) 1000 UNIT/ML IJ SOLN
INTRAMUSCULAR | Status: DC | PRN
  Administered 2024-04-18: 10000 [IU] via INTRAVENOUS

## 2024-04-18 MED ORDER — MORPHINE SULFATE (PF) 2 MG/ML IV SOLN: 1.0000 mg | INTRAVENOUS | Status: DC | PRN

## 2024-04-18 MED ORDER — ATROPINE SULFATE 1 MG/ML IV SOLN
INTRAVENOUS | Status: DC | PRN
  Administered 2024-04-18: 1 mg via INTRAVENOUS

## 2024-04-18 MED ORDER — LIDOCAINE 2% (20 MG/ML) 5 ML SYRINGE
INTRAMUSCULAR | Status: DC | PRN
  Administered 2024-04-18: 80 mg via INTRAVENOUS

## 2024-04-18 MED ORDER — DEXAMETHASONE SOD PHOSPHATE PF 10 MG/ML IJ SOLN
INTRAMUSCULAR | Status: DC | PRN
  Administered 2024-04-18: 5 mg via INTRAVENOUS

## 2024-04-18 MED ORDER — PROTAMINE SULFATE 10 MG/ML IV SOLN
INTRAVENOUS | Status: DC | PRN
  Administered 2024-04-18: 40 mg via INTRAVENOUS

## 2024-04-18 MED ORDER — ROCURONIUM BROMIDE 10 MG/ML (PF) SYRINGE
PREFILLED_SYRINGE | INTRAVENOUS | Status: DC | PRN
  Administered 2024-04-18: 80 mg via INTRAVENOUS

## 2024-04-18 MED ORDER — ACETAMINOPHEN 325 MG PO TABS: 650.0000 mg | ORAL_TABLET | ORAL | Status: DC | PRN

## 2024-04-18 MED ORDER — OXYCODONE HCL 5 MG/5ML PO SOLN
5.0000 mg | Freq: Once | ORAL | Status: DC | PRN
  Filled 2024-04-18: qty 5

## 2024-04-18 MED ORDER — PHENYLEPHRINE 80 MCG/ML (10ML) SYRINGE FOR IV PUSH (FOR BLOOD PRESSURE SUPPORT)
PREFILLED_SYRINGE | INTRAVENOUS | Status: DC | PRN
  Administered 2024-04-18: 80 ug via INTRAVENOUS
  Administered 2024-04-18: 160 ug via INTRAVENOUS
  Administered 2024-04-18: 80 ug via INTRAVENOUS

## 2024-04-18 MED ORDER — OXYCODONE HCL 5 MG PO TABS: 5.0000 mg | ORAL_TABLET | Freq: Once | ORAL | Status: DC | PRN

## 2024-04-18 MED ORDER — AMIODARONE HCL 200 MG PO TABS: ORAL_TABLET | ORAL | Status: AC

## 2024-04-18 MED ORDER — SODIUM CHLORIDE 0.9 % IV SOLN: INTRAVENOUS | Status: DC

## 2024-04-18 MED ORDER — FENTANYL CITRATE (PF) 100 MCG/2ML IJ SOLN
INTRAMUSCULAR | Status: AC
  Filled 2024-04-18: qty 2

## 2024-04-18 MED ORDER — ONDANSETRON HCL 4 MG/2ML IJ SOLN: 4.0000 mg | Freq: Four times a day (QID) | INTRAMUSCULAR | Status: DC | PRN

## 2024-04-18 MED ORDER — PROPOFOL 10 MG/ML IV BOLUS
INTRAVENOUS | Status: DC | PRN
  Administered 2024-04-18: 110 mg via INTRAVENOUS

## 2024-04-18 MED ORDER — HEPARIN (PORCINE) IN NACL 1000-0.9 UT/500ML-% IV SOLN
INTRAVENOUS | Status: DC | PRN
  Administered 2024-04-18 (×3): 500 mL

## 2024-04-18 MED ORDER — SUGAMMADEX SODIUM 200 MG/2ML IV SOLN
INTRAVENOUS | Status: DC | PRN
  Administered 2024-04-18: 200 mg via INTRAVENOUS

## 2024-04-18 MED ORDER — AMISULPRIDE (ANTIEMETIC) 5 MG/2ML IV SOLN: 10.0000 mg | Freq: Once | INTRAVENOUS | Status: DC | PRN

## 2024-04-18 MED ORDER — SODIUM CHLORIDE 0.9 % IV SOLN: 250.0000 mL | INTRAVENOUS | Status: DC | PRN

## 2024-04-18 MED ORDER — SODIUM CHLORIDE 0.9% FLUSH: 3.0000 mL | INTRAVENOUS | Status: DC | PRN

## 2024-04-18 MED ORDER — ONDANSETRON HCL 4 MG/2ML IJ SOLN
INTRAMUSCULAR | Status: DC | PRN
  Administered 2024-04-18: 4 mg via INTRAVENOUS

## 2024-04-18 NOTE — H&P (Signed)
" ° °  PCP: Thomas Caroline, MD Primary Cardiologist: Dr Arthea Primary EP: Dr Kelsie  Thomas Simon is a 79 y.o. male who presents today for routine electrophysiology followup.  Since last being seen in our clinic, the patient reports doing very well.  Today,   He was diagnosed with atrial fibrillation in November 2021, presenting with symptoms of fatigue and shortness of breath.  DC cardioversion was performed in January 2022 but he had recurrence of atrial fibrillation prior to leaving the hospital.  Ablation was performed in March 2022.  He had recurrence of atrial fibrillation 2 days after his ablation.  Amiodarone  was restarted, and he maintained sinus rhythm for quite some time.  Amiodarone  was discontinued in April 2025, and he had recurrence of atrial fibrillation within a month afterwards.  DC cardioversion was performed in May, and he maintained sinus rhythm until October.  At that time amiodarone  was started and he underwent another DC cardioversion in November 2025.   I reviewed the patient's CT and labs. There was no LAA thrombus. he  has not missed any doses of anticoagulation, and he took his dose last night. There have been no changes in the patient's diagnoses, medications, or condition since our recent clinic visit.    Physical Exam: Vitals:   04/18/24 0519  BP: 134/68  Pulse: 70  Resp: 14  Temp: 97.7 F (36.5 C)  TempSrc: Oral  SpO2: 97%  Weight: 65.8 kg  Height: 5' 7 (1.702 m)    GEN- The patient is well appearing, alert and oriented x 3 today.   Head- normocephalic, atraumatic Eyes-  Sclera clear, conjunctiva pink Ears- hearing intact Oropharynx- clear Lungs- Clear to ausculation bilaterally, normal work of breathing Heart- Regular rate and rhythm, no murmurs, rubs or gallops, PMI not laterally displaced GI- soft, NT, ND, + BS Extremities- no clubbing, cyanosis, or edema  Wt Readings from Last 3 Encounters:  04/18/24 65.8 kg  02/20/24 71.2 kg  02/14/24 65.8  kg    EKG tracing ordered today is personally reviewed and shows sinus  Assessment and Plan:  Persistent afib Status post ablation with radiofrequency in March 2022. Now with recurrence of atrial fibrillation and quite symptomatic. We discussed management options.  Because he is symptomatic, I think it reasonable to repeat atrial mapping and possible ablation with pulsed field energy since the procedure is significantly faster and safer than prior modalities.  I explained the logistics and risks of the procedure which include but are not limited to bleeding at the access site, need for prolonged hospital stay or cardiac surgery, and a low but nonzero risk of stroke heart attack or death.  He would like to proceed. Chads2vasc score is 5.  He is on eliquis  5 BID. Will plan for cardiac CT.  Will plan to use the Carto system.   2. CAD s/p CABG Stable No changes today  3. HTN Stable No change required today Bmet today  Risks, benefits and potential toxicities for medications prescribed and/or refilled reviewed with patient today.   Return to AF clinic in 6 months I will see in a year  [name]  04/18/2024 7:12 AM     "

## 2024-04-18 NOTE — Discharge Instructions (Signed)

## 2024-04-18 NOTE — Anesthesia Postprocedure Evaluation (Signed)
"   Anesthesia Post Note  Patient: Thomas Simon  Procedure(s) Performed: ATRIAL FIBRILLATION ABLATION     Patient location during evaluation: PACU Anesthesia Type: General Level of consciousness: awake and alert Pain management: pain level controlled Vital Signs Assessment: post-procedure vital signs reviewed and stable Respiratory status: spontaneous breathing, nonlabored ventilation and respiratory function stable Cardiovascular status: blood pressure returned to baseline and stable Postop Assessment: no apparent nausea or vomiting Anesthetic complications: no   There were no known notable events for this encounter.  Last Vitals:  Vitals:   04/18/24 0943 04/18/24 0953  BP: (!) 100/49 108/63  Pulse: (!) 54 (!) 56  Resp: 18 18  Temp:    SpO2: 93% 97%    Last Pain:  Vitals:   04/18/24 0953  TempSrc:   PainSc: 0-No pain                 Butler Levander Pinal      "

## 2024-04-18 NOTE — Anesthesia Preprocedure Evaluation (Addendum)
"                                    Anesthesia Evaluation  Patient identified by MRN, date of birth, ID band Patient awake    Reviewed: Allergy & Precautions, NPO status , Patient's Chart, lab work & pertinent test results  Airway Mallampati: II  TM Distance: >3 FB Neck ROM: Full    Dental  (+) Partial Upper, Edentulous Lower, Dental Advisory Given   Pulmonary former smoker   Pulmonary exam normal breath sounds clear to auscultation       Cardiovascular hypertension, Pt. on home beta blockers and Pt. on medications + CAD and + Peripheral Vascular Disease  Normal cardiovascular exam+ dysrhythmias Atrial Fibrillation  Rhythm:Regular Rate:Normal     Neuro/Psych negative neurological ROS     GI/Hepatic negative GI ROS, Neg liver ROS,,,  Endo/Other  diabetes    Renal/GU negative Renal ROS     Musculoskeletal negative musculoskeletal ROS (+)    Abdominal   Peds  Hematology negative hematology ROS (+)   Anesthesia Other Findings   Reproductive/Obstetrics                              Anesthesia Physical Anesthesia Plan  ASA: III  Anesthesia Plan: General   Post-op Pain Management:    Induction: Intravenous  PONV Risk Score and Plan: 2 and Ondansetron , Treatment may vary due to age or medical condition and Midazolam  Airway Management Planned: Oral ETT  Additional Equipment:   Intra-op Plan:   Post-operative Plan: Extubation in OR  Informed Consent: I have reviewed the patients History and Physical, chart, labs and discussed the procedure including the risks, benefits and alternatives for the proposed anesthesia with the patient or authorized representative who has indicated his/her understanding and acceptance.     Dental advisory given  Plan Discussed with: CRNA  Anesthesia Plan Comments:          Anesthesia Quick Evaluation  "

## 2024-04-18 NOTE — Anesthesia Procedure Notes (Signed)
 Procedure Name: Intubation Date/Time: 04/18/2024 7:49 AM  Performed by: Vera Rochele PARAS, CRNAPre-anesthesia Checklist: Patient identified, Emergency Drugs available, Suction available and Patient being monitored Patient Re-evaluated:Patient Re-evaluated prior to induction Oxygen Delivery Method: Circle System Utilized Preoxygenation: Pre-oxygenation with 100% oxygen Induction Type: IV induction Ventilation: Mask ventilation without difficulty Laryngoscope Size: Miller and 3 Tube type: Oral Tube size: 7.5 mm Number of attempts: 1 Airway Equipment and Method: Stylet and Oral airway Placement Confirmation: ETT inserted through vocal cords under direct vision, positive ETCO2 and breath sounds checked- equal and bilateral Secured at: 23 cm Tube secured with: Tape Dental Injury: Teeth and Oropharynx as per pre-operative assessment

## 2024-04-18 NOTE — Transfer of Care (Signed)
 Immediate Anesthesia Transfer of Care Note  Patient: Thomas Simon  Procedure(s) Performed: ATRIAL FIBRILLATION ABLATION  Patient Location: PACU  Anesthesia Type:General  Level of Consciousness: awake, alert , and oriented  Airway & Oxygen Therapy: Patient Spontanous Breathing and Patient connected to face mask oxygen  Post-op Assessment: Report given to RN and Post -op Vital signs reviewed and stable  Post vital signs: Reviewed and stable  Last Vitals:  Vitals Value Taken Time  BP 128/61 04/18/24 08:55  Temp 36.5 C 04/18/24 08:52  Pulse 58 04/18/24 09:00  Resp 14 04/18/24 09:00  SpO2 99 % 04/18/24 09:00  Vitals shown include unfiled device data.  Last Pain:  Vitals:   04/18/24 0852  TempSrc: Oral  PainSc: 0-No pain         Complications: There were no known notable events for this encounter.

## 2024-04-19 ENCOUNTER — Encounter (HOSPITAL_COMMUNITY): Payer: Self-pay | Admitting: Cardiovascular Disease

## 2024-04-21 ENCOUNTER — Telehealth (HOSPITAL_COMMUNITY): Payer: Self-pay

## 2024-04-21 NOTE — Telephone Encounter (Addendum)
 Spoke with patient to complete post procedure follow up call.  Patient reports no complications with groin sites.   Instructions reviewed with patient:  It is normal to have bruising, tenderness, mild swelling, and a pea or marble sized lump/knot at the groin site which can take up to three months to resolve.  Get help right away if you notice sudden swelling at the puncture site.  Check your puncture site every day for signs of infection: fever, redness, swelling, pus drainage, warmth, foul odor or excessive pain. If this occurs, please call [phone], to speak with the RN Navigator. Get help right away if your puncture site is bleeding and the bleeding does not stop after applying firm pressure to the area.  You may continue to have skipped beats/ atrial fibrillation during the first several months after your procedure.  It is very important not to miss any doses of your blood thinner Eliquis.    You will follow up with the Afib clinic 4 weeks after your procedure and follow up with the APP 3 months after your procedure.  Activity restrictions reviewed.  Patient verbalized understanding to all instructions provided.

## 2024-05-02 ENCOUNTER — Encounter: Payer: Self-pay | Admitting: Emergency Medicine

## 2024-05-20 ENCOUNTER — Ambulatory Visit (HOSPITAL_COMMUNITY): Admitting: Internal Medicine

## 2024-05-23 ENCOUNTER — Ambulatory Visit (HOSPITAL_COMMUNITY): Admitting: Internal Medicine

## 2024-07-21 ENCOUNTER — Ambulatory Visit: Admitting: Physician Assistant
